# Patient Record
Sex: Female | Born: 1995
Health system: Southern US, Community
[De-identification: ages and names within clinical notes are randomized; demographics above are authoritative.]

## PROBLEM LIST (undated history)

## (undated) DIAGNOSIS — R001 Bradycardia, unspecified: Secondary | ICD-10-CM

## (undated) HISTORY — DX: Bradycardia, unspecified: R00.1

---
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
## 2018-04-05 ENCOUNTER — Encounter: Payer: Self-pay | Admitting: Family Medicine

## 2018-04-05 ENCOUNTER — Ambulatory Visit (INDEPENDENT_AMBULATORY_CARE_PROVIDER_SITE_OTHER): Payer: 59 | Admitting: Family Medicine

## 2018-04-05 VITALS — BP 100/70 | HR 48 | Ht 66.0 in | Wt 130.2 lb

## 2018-04-05 DIAGNOSIS — N911 Secondary amenorrhea: Secondary | ICD-10-CM | POA: Diagnosis not present

## 2018-04-05 DIAGNOSIS — R5383 Other fatigue: Secondary | ICD-10-CM | POA: Diagnosis not present

## 2018-04-05 DIAGNOSIS — E0789 Other specified disorders of thyroid: Secondary | ICD-10-CM | POA: Insufficient documentation

## 2018-04-05 DIAGNOSIS — Z789 Other specified health status: Secondary | ICD-10-CM | POA: Diagnosis not present

## 2018-04-05 LAB — MAGNESIUM: MAGNESIUM: 2.1 mg/dL (ref 1.5–2.5)

## 2018-04-05 LAB — CBC
HEMATOCRIT: 45.8 % (ref 36.0–46.0)
HEMOGLOBIN: 15.6 g/dL — AB (ref 12.0–15.0)
MCHC: 34.1 g/dL (ref 30.0–36.0)
MCV: 101.5 fl — ABNORMAL HIGH (ref 78.0–100.0)
Platelets: 251 10*3/uL (ref 150.0–400.0)
RBC: 4.51 Mil/uL (ref 3.87–5.11)
RDW: 12.6 % (ref 11.5–15.5)
WBC: 4.3 10*3/uL (ref 4.0–10.5)

## 2018-04-05 LAB — TSH: TSH: 1.95 u[IU]/mL (ref 0.35–4.50)

## 2018-04-05 LAB — VITAMIN B12: VITAMIN B 12: 1109 pg/mL — AB (ref 211–911)

## 2018-04-05 NOTE — Progress Notes (Signed)
Subjective:  Patient ID: Janet Hahn, female    DOB: 11/02/1996  Age: 22 y.o. MRN: [id number]  CC: Establish Care   HPI Janet Hahn presents for ongoing fatigue.  She is a Holiday representativesenior at [employer].  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  She runs up to 60 miles per week.  She has 2 periods per year.  This is her usual mileage.  She consumes a vegan diet.  She does not use illicit drugs, drink alcohol or smoke.  She has been sleeping normally.  She denies depression or anxiety.  She has a significant other and denies problems in the relationship.  [place]  She is planning on pursuing her nursing degree of blood upon completion of her undergraduate degree.  She tells me she has a history of an enlarged thyroid gland.  She is compliant with her oral contraceptives.  History Janet Hahn has no past medical history on file.   She has no past surgical history on file.   Her family history is not on file.She reports that she has never smoked. She has never used smokeless tobacco. Her alcohol and drug histories are not on file.  Outpatient Medications Prior to Visit  Medication Sig Dispense Refill  . levonorgestrel-ethinyl estradiol (NORDETTE) 0.15-30 MG-MCG tablet Take 1 tablet by mouth daily.     No facility-administered medications prior to visit.     ROS Review of Systems  Constitutional: Positive for fatigue. Negative for chills, fever and unexpected weight change.  HENT: Negative.   Eyes: Negative.   Respiratory: Negative for chest tightness, shortness of breath and wheezing.   Cardiovascular: Negative for chest pain, palpitations and leg swelling.  Gastrointestinal: Negative.   Endocrine: Negative for cold intolerance and heat intolerance.  Genitourinary: Negative.   Musculoskeletal: Negative for arthralgias, gait problem and joint swelling.  Skin: Negative for pallor and rash.  Allergic/Immunologic: Negative for immunocompromised state.    Neurological: Negative for headaches.  Hematological: Does not bruise/bleed easily.  Psychiatric/Behavioral: Negative for decreased concentration and dysphoric mood. The patient is not nervous/anxious.     Objective:  BP 100/70   Pulse (!) 48   Ht 5\' 6"  (1.676 m)   Wt 130 lb 4 oz (59.1 kg)   SpO2 99%   BMI 21.02 kg/m   Physical Exam  Constitutional: She is oriented to person, place, and time. She appears well-developed and well-nourished. No distress.  HENT:  Head: Normocephalic and atraumatic.  Right Ear: External ear normal.  Left Ear: External ear normal.  Nose: Nose normal.  Mouth/Throat: Oropharynx is clear and moist. No oropharyngeal exudate.  Eyes: Pupils are equal, round, and reactive to light. Conjunctivae and EOM are normal. Right eye exhibits no discharge. Left eye exhibits no discharge. No scleral icterus.  Neck: Normal range of motion. Neck supple. No JVD present. No tracheal deviation present. No thyromegaly (mild ttp) present.  Cardiovascular: Normal rate, regular rhythm and normal heart sounds.  Pulmonary/Chest: Effort normal and breath sounds normal.  Abdominal: Soft. Bowel sounds are normal. She exhibits no distension and no mass. There is no tenderness. There is no rebound and no guarding.  Musculoskeletal: She exhibits no edema.  Lymphadenopathy:    She has no cervical adenopathy.  Neurological: She is alert and oriented to person, place, and time.  Skin: Skin is warm and dry. She is not diaphoretic. No erythema. No pallor.  Psychiatric: She has a normal mood and affect.  Her behavior is normal. Thought content normal.      Assessment & Plan:   Janet Hahn was seen today for establish care.  Diagnoses and all orders for this visit:  Fatigue, unspecified type -     CBC -     TSH -     Iron, TIBC and Ferritin Panel  Consumes a vegan diet -     CBC -     Iron, TIBC and Ferritin Panel -     Magnesium -     B12  Secondary physiologic  amenorrhea  Painful thyroid -     TSH -     T3 -     Thyroid stimulating immunoglobulin   I have discontinued [name]'s levonorgestrel-ethinyl estradiol.  No orders of the defined types were placed in this encounter.    Follow-up: Return in about 1 month (around 05/05/2018).  Mliss Sax, MD

## 2018-04-05 NOTE — Patient Instructions (Signed)
Overtraining in Athletes Overtraining is when an athlete trains too hard or for too long. Overtraining can cause a variety of problems. It can hurt athletic performance, lead to injury, and cause physical and mental symptoms. Overtraining is also called burnout. What are the causes? This condition is caused by working too hard and too long at a sport or activity. It happens when the body does not have enough time to recover. This condition can happen:  If you repeat the same motions every day, with no variety in activity.  If you practice the same sport for more than 5 days a week with no break during those 5 days.  If you practice the same sport for more than 10 months a year with no break during those 10 months.  What increases the risk? This condition is more likely to develop in:  Adolescents whose bodies are still developing and growing.  Athletes who specialize in one sport.  Athletes who do not take adequate breaks between days of practice or between seasons of competition.  What are the signs or symptoms? Symptoms of this condition include:  Joint and muscle pain.  Fast heart rate.  Loss of appetite.  Getting sick more often than before.  Fatigue.  Trouble concentrating.  Lack of usual interest in a sport.  Difficulty completing usual routines and practices.  Changes in personality, such as being irritable or moody.  How is this diagnosed? This condition is diagnosed based on symptoms and the history of physical activity. Your health care provider may do a physical exam. How is this treated? This condition is treated by making changes to your usual routine. Changes may include:  Resting regularly.  Taking a break from your training.  Adding more variety into your workouts. This will also help prevent repetitive use injuries.  Reducing your training and competition schedule.  Follow these instructions at home:  Rest as told by your health care  provider.  Make changes to your usual routine as recommended by your health care provider. How is this prevented?  Focus on variety and fun in athletic training, rather than on repetition and intensity.  Take breaks between practices and seasons as needed.  Do not increase repetitions, distance, or other training goals by more than 10 percent each week.  Do not push yourself too hard. Contact a health care provider if:  You have lasting joint and muscle pain.  Your resting heart rate is faster than normal.  You are getting sick more often than before.  You lose your appetite.  You feel sad.  You have trouble concentrating or doing daily tasks. This information is not intended to replace advice given to you by your health care provider. Make sure you discuss any questions you have with your health care provider. Document Released: 10/23/2005 Document Revised: 12/07/2015 Document Reviewed: 05/12/2015 Elsevier Interactive Patient Education  2018 [employer].  Vegan Diet The vegan diet excludes all foods that come from animals, including foods that are made from meat, fish, poultry, dairy, and eggs. A vegan diet may be followed for ethical or health reasons. What do I need to know about the vegan diet?  This diet includes all foods that come from plants. Some people choose not to eat sea vegetables, such as seaweed.  This diet excludes foods that come from animals.  While following this diet, it is important to find plant sources or supplements that contain the nutrients that are commonly found in animal products. Talk with your  health care provider or dietitian about whether you should be taking nutritional supplements. What foods can I eat? Grains  Any. Vegetables Any (except for sea vegetables, if you choose not to eat them). Fruits Any. Protein Sources Tofu. Tempeh. Beans. Nuts. Seeds. Dairy Any dairy product that is made with milk that comes from soy, almonds,  rice, hemp, or coconut. Beverages Juice. Carbonated soft drinks. Coconut milk. Tea. Sweets and Desserts Any dessert that is labeled as vegan. Ice cream that is made with soy, almond, rice, hemp, or coconut milk and does not have other animal ingredients (such as chocolate chips that are made from cow milk). Fats and Oils All vegetable-based oils, such as olive, canola, coconut, corn, safflower, peanut, and sesame oils. All vegan butters. The items listed above may not be a complete list of recommended foods or beverages. Contact your dietitian for more options. What foods should I avoid? Grains No grains need to be avoided. All grains are included in this diet. Vegetables Sea vegetables (if you choose not to eat them). Fruits No fruits need to be avoided. All fruits are included in this diet. Protein Sources Meats. Poultry. Eggs. Fish. Seafood. Dairy Milk, cheese, yogurt, or ice cream that is made from cow milk, goat milk, or sheep milk. Beverages Cow milk. Goat milk. Sheep milk. Drinks that are sweetened with honey. Condiments Honey. Sweets and Desserts Any desserts that are made with eggs or animal milk. Honey. Cheesecake. Fats and Oils Butter. Lard. The items listed above may not be a complete list of foods and beverages to avoid. Contact your dietitian for more information. Where can I get the nutrients that are commonly found in animal products? Nutrients that are commonly found in animal products and may not be as common in a vegan diet include:  Protein.  Vitamin B12.  Vitamin D.  Iron.  Omega-3 fatty acids.  Calcium.  Zinc.  The following is a list of some plant-based sources of these nutrients. Protein Beans, such as black beans or kidney beans. Other legumes, such as lentils and split peas. Soy products. Nuts, such as almonds, [place] nuts, and pecans. Seeds, such as sunflower seeds. Tofu. Tempeh. Hummus. Vitamin B12 Breakfast cereals and prepared products  that have added vitamin B12. Vitamin D Orange juice. Fortified mushrooms. Cereals with added vitamin D. Iron Dark leafy greens. Nuts. Beans. Grain products that have added iron, such as cereals. Tofu. Tempeh. Soybeans. Quinoa. Plant-based iron is absorbed better when it is eaten with a food that has vitamin C. Omega-3 Fatty Acids Walnuts. Foods with added omega-3 fatty acids, such as juices. Flax seeds. Canola oil and soybean oil. Tofu. Calcium Dark leafy greens, such as kale, bok choy, Chinese cabbage, collard greens, and mustard greens. Broccoli. Okra. Soy products with added calcium. Calcium-fortified breakfast cereals. Calcium-fortified fruit juices. Figs. Zinc Wheat germ, cereals, and breads that have added zinc. Baked beans. Legumes, such as cashews, chickpeas, kidney beans, and green peas. Almonds and nut butters. Tofu and other soy products. This information is not intended to replace advice given to you by your health care provider. Make sure you discuss any questions you have with your health care provider. Document Released: 06/26/2014 Document Revised: 05/12/2016 Document Reviewed: 04/07/2014 Elsevier Interactive Patient Education  [employer].

## 2018-04-07 LAB — IRON,TIBC AND FERRITIN PANEL
%SAT: 61 % — AB (ref 11–50)
Ferritin: 36 ng/mL (ref 10–154)
Iron: 213 ug/dL — ABNORMAL HIGH (ref 40–190)
TIBC: 347 mcg/dL (calc) (ref 250–450)

## 2018-04-07 LAB — THYROID STIMULATING IMMUNOGLOBULIN

## 2018-04-07 LAB — T3: T3 TOTAL: 94 ng/dL (ref 76–181)

## 2018-09-24 ENCOUNTER — Encounter: Payer: Self-pay | Admitting: Family Medicine

## 2018-09-24 ENCOUNTER — Ambulatory Visit: Payer: BLUE CROSS/BLUE SHIELD | Admitting: Family Medicine

## 2018-09-24 VITALS — BP 90/60 | HR 46 | Ht 66.0 in | Wt 105.2 lb

## 2018-09-24 DIAGNOSIS — R634 Abnormal weight loss: Secondary | ICD-10-CM | POA: Insufficient documentation

## 2018-09-24 DIAGNOSIS — M818 Other osteoporosis without current pathological fracture: Secondary | ICD-10-CM

## 2018-09-24 DIAGNOSIS — R011 Cardiac murmur, unspecified: Secondary | ICD-10-CM

## 2018-09-24 DIAGNOSIS — Z789 Other specified health status: Secondary | ICD-10-CM | POA: Diagnosis not present

## 2018-09-24 DIAGNOSIS — F509 Eating disorder, unspecified: Secondary | ICD-10-CM | POA: Diagnosis not present

## 2018-09-24 DIAGNOSIS — N912 Amenorrhea, unspecified: Secondary | ICD-10-CM

## 2018-09-24 DIAGNOSIS — N911 Secondary amenorrhea: Secondary | ICD-10-CM

## 2018-09-24 LAB — COMPREHENSIVE METABOLIC PANEL
ALBUMIN: 4.5 g/dL (ref 3.5–5.2)
ALT: 24 U/L (ref 0–35)
AST: 22 U/L (ref 0–37)
Alkaline Phosphatase: 52 U/L (ref 39–117)
BILIRUBIN TOTAL: 1 mg/dL (ref 0.2–1.2)
BUN: 10 mg/dL (ref 6–23)
CALCIUM: 9.9 mg/dL (ref 8.4–10.5)
CO2: 25 meq/L (ref 19–32)
CREATININE: 0.72 mg/dL (ref 0.40–1.20)
Chloride: 103 mEq/L (ref 96–112)
GFR: 108.12 mL/min (ref >60.00)
Glucose, Bld: 73 mg/dL (ref 70–99)
Potassium: 4.2 mEq/L (ref 3.5–5.1)
SODIUM: 140 meq/L (ref 135–145)
Total Protein: 6.9 g/dL (ref 6.0–8.3)

## 2018-09-24 LAB — CBC
HEMATOCRIT: 46.6 % — AB (ref 36.0–46.0)
HEMOGLOBIN: 15.9 g/dL — AB (ref 12.0–15.0)
MCHC: 34.1 g/dL (ref 30.0–36.0)
MCV: 103.6 fl — ABNORMAL HIGH (ref 78.0–100.0)
Platelets: 210 10*3/uL (ref 150.0–400.0)
RBC: 4.49 Mil/uL (ref 3.87–5.11)
RDW: 13.4 % (ref 11.5–15.5)
WBC: 3 10*3/uL — ABNORMAL LOW (ref 4.0–10.5)

## 2018-09-24 LAB — VITAMIN D 25 HYDROXY (VIT D DEFICIENCY, FRACTURES): VITD: 33.86 ng/mL (ref 30.00–100.00)

## 2018-09-24 LAB — VITAMIN B12: Vitamin B-12: 1002 pg/mL — ABNORMAL HIGH (ref 211–911)

## 2018-09-24 LAB — MAGNESIUM: MAGNESIUM: 2.1 mg/dL (ref 1.5–2.5)

## 2018-09-24 NOTE — Progress Notes (Signed)
Subjective:  Patient ID: Janet MoanHolly R Hahn, female    DOB: 10/18/1996  Age: 22 y.o. MRN: [id number]  CC: discuss weight   HPI Janet Hahn presents for follow-up of her fatigue and physiological amenorrhea for 2 years now.  This coincides with her vegan diet.  She has lost another 25 pounds since her last clinic visit.  She is maintaining a vigorous running schedule of 60 miles per week followed by 2 days of weight training.  Her training regimen has not varied.  She maintains that she is feeling quite well.  Recovery times okay from running are excellent.  She does recognize the fact that she is too thin.  She consumes a strict vegan diet.  Her protein intake comes exclusively from legumes and grains.  She denies binging and purging.  Patient denies night sweats.  No outpatient medications prior to visit.   No facility-administered medications prior to visit.     ROS Review of Systems  Constitutional: Positive for unexpected weight change. Negative for chills, diaphoresis, fatigue and fever.  HENT: Negative.   Eyes: Negative.   Respiratory: Negative.  Negative for chest tightness, shortness of breath and wheezing.   Cardiovascular: Negative.  Negative for chest pain, palpitations and leg swelling.  Endocrine: Negative for polyphagia.  Genitourinary: Negative.   Musculoskeletal: Negative for gait problem and joint swelling.  Skin: Negative for pallor and rash.  Allergic/Immunologic: Negative for immunocompromised state.  Neurological: Negative for weakness and numbness.  Hematological: Does not bruise/bleed easily.  Psychiatric/Behavioral: Negative for behavioral problems and dysphoric mood. The patient is not nervous/anxious.     Objective:  BP 90/60 (BP Location: Right Arm, Patient Position: Sitting, Cuff Size: Normal)   Pulse (!) 46   Ht 5\' 6"  (1.676 m)   Wt 105 lb 4 oz (47.7 kg)   SpO2 100%   BMI 16.99 kg/m   BP Readings from Last 3 Encounters:  09/24/18 90/60    04/05/18 100/70  07/06/15 (!) 100/50    Wt Readings from Last 3 Encounters:  09/24/18 105 lb 4 oz (47.7 kg)  04/05/18 130 lb 4 oz (59.1 kg)  07/06/15 132 lb 9.6 oz (60.1 kg) (64 %, Z= 0.36)*   * Growth percentiles are based on CDC (Girls, 2-20 Years) data.    Physical Exam  Constitutional: She is oriented to person, place, and time.  HENT:  Head: Normocephalic and atraumatic.  Right Ear: External ear normal.  Left Ear: External ear normal.  Mouth/Throat: Oropharynx is clear and moist. No oropharyngeal exudate.  Eyes: Pupils are equal, round, and reactive to light. Conjunctivae and EOM are normal. Right eye exhibits no discharge. Left eye exhibits no discharge. No scleral icterus.  Neck: Neck supple. No JVD present. No tracheal deviation present. No thyromegaly present.  Cardiovascular: Normal rate and regular rhythm.  Murmur heard. Pulmonary/Chest: Effort normal and breath sounds normal.  Abdominal: Soft. Bowel sounds are normal. She exhibits no distension and no mass. There is no tenderness. There is no guarding.  Musculoskeletal: She exhibits no edema.  Neurological: She is alert and oriented to person, place, and time.  Skin: Skin is warm and dry. Capillary refill takes less than 2 seconds. There is pallor.  Psychiatric: She has a normal mood and affect. Her behavior is normal.   Depression screen [facility] 2/9 09/24/2018  Decreased Interest 0  Down, Depressed, Hopeless 0  PHQ - 2 Score 0  Altered sleeping 0  Tired, decreased energy 0  Change in appetite  0  Feeling bad or failure about yourself  0  Trouble concentrating 1  Moving slowly or fidgety/restless 0  Suicidal thoughts 0  PHQ-9 Score 1     Lab Results  Component Value Date   WBC 4.3 04/05/2018   HGB 15.6 (H) 04/05/2018   HCT 45.8 04/05/2018   PLT 251.0 04/05/2018   GLUCOSE 85 07/06/2015   ALT 15 07/06/2015   AST 19 07/06/2015   NA 137 07/06/2015   K 4.5 07/06/2015   CL 104 07/06/2015   CREATININE 0.68  07/06/2015   BUN 15 07/06/2015   CO2 23 07/06/2015   TSH 1.95 04/05/2018    Patient was never admitted.  Assessment & Plan:   Janet Hahn was seen today for discuss weight.  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  No orders of the defined types were placed in this encounter.  Patient is agreeable to seeing sports medicine.  She was introduced to Dr. Jordan Likes.  Recommended nutritional counseling and her coach would like for her to see the athletic nutritionist at [facility]  Patient may also benefit from psychology referral.  PHQ 9 score was 1 today.  Follow-up: Return in about 6 weeks (around 11/05/2018).  Mliss Sax, MD

## 2018-09-24 NOTE — Patient Instructions (Signed)
Menstrual Dysfunction in Athletes  Exercise has many health benefits, including maintaining good heart and lung function, strength, and flexibility. However, for girls and women, extreme amounts or intensity of exercise can have some less desirable effects, especially on menstrual cycles. This can result in periods that do not occur regularly or are abnormally absent (menstrual dysfunction). This may also be called amenorrhea. There are different types of menstrual dysfunction:  · Oligomenorrhea. This is having fewer than normal periods per year, or having longer than normal time lapses between periods.  · Primary amenorrhea. This is when a girl has not yet started having her period (menarche) by the time she is 21 years old. For some girls, genetic factors may delay menarche.  · Secondary amenorrhea. This is the absence of three or more periods in a row, after menarche has already taken place.  · Athletic amenorrhea. This is the absence of periods that is caused by exercise. It can be primary or secondary, depending on whether menarche has already occurred.    What are the causes?  There is no known single cause of menstrual dysfunction in athletes. These conditions can develop for different reasons in different women. In general, these conditions are thought to occur because of:  · Hormone imbalance.  · Altered metabolism.  · Decreased body fat percentage.  · Genetic inheritance.  · Psychological stress.  · Ongoing negative energy balance. This means that you consume fewer calories than you burn each day.    What increases the risk?  This condition is more likely to develop in:  · Girls and women who do activities that encourage or result in leanness, such as endurance sports, body building, and dance.  · Girls and women who have experienced rapid weight loss.  · Girls and women who engage in frequent, vigorous exercise or sports training.  · Girls and women who have a mother who experienced menstrual  dysfunction.    What are the signs or symptoms?  Symptoms of this condition include:  · Absence of menstrual periods.  · Irregular menstrual periods.  · Increasing intervals of time between menstrual periods.  · Stress fractures. These are small breaks or cracks in a bone.    How is this diagnosed?  This condition is diagnosed with a medical history and physical exam. Your health care provider will ask you about your menstrual history and ask how much you exercise. You may also need to have tests to help confirm the diagnosis and to ensure that your menstrual dysfunction is not caused by another medical condition. These tests may include:  · Blood tests.  · Urine tests.  · Dietary evaluation by a specialist (dietitian).  · Body fat composition analysis.  · A test to check how dense your bones are (bone densitometry).  · Ultrasound.    How is this treated?  Treatment for menstrual dysfunction in athletes depends on the suspected cause of the condition. Some treatment options include:  · Decreasing the amount or intensity of daily exercise.  · Increasing body weight.  · Reducing stress.  · Improving the amount or quality of sleep.  · Medicines. These may include hormone replacement.  · Vitamin and mineral supplements.    Your health care provider may also advise you to work with a registered dietitian to make changes to your diet.  Follow these instructions at home:  · Exercise as told by your health care provider.  · Try to reduce your stress, such as with yoga   or meditation. If you need help doing this, ask your health care provider.  · Take over-the-counter and prescription medicines only as told by your health care provider.  · Take vitamin and mineral supplements only as told by your health care provider.  · Get enough sleep. Talk with your health care provider about how much sleep you should be getting.  · Try to maintain or gain weight as told by your health care provider. This may include eating a diet as  suggested by your dietitian. Avoid trying to lose weight unless it has been discussed with your health care provider.  · Keep all follow-up visits as told by your health care provider. This is important.  This information is not intended to replace advice given to you by your health care provider. Make sure you discuss any questions you have with your health care provider.  Document Released: 10/23/2005 Document Revised: 03/30/2016 Document Reviewed: 12/31/2014  Elsevier Interactive Patient Education © 2018 [employer] Inc[employer]

## 2018-09-25 LAB — HIV ANTIBODY (ROUTINE TESTING W REFLEX): HIV 1&2 Ab, 4th Generation: NONREACTIVE

## 2018-09-25 LAB — IRON,TIBC AND FERRITIN PANEL
%SAT: 53 % (calc) — ABNORMAL HIGH (ref 16–45)
Ferritin: 59 ng/mL (ref 16–154)
Iron: 161 ug/dL (ref 40–190)
TIBC: 301 mcg/dL (calc) (ref 250–450)

## 2018-10-01 ENCOUNTER — Ambulatory Visit: Payer: BLUE CROSS/BLUE SHIELD | Admitting: Family Medicine

## 2018-10-01 ENCOUNTER — Encounter: Payer: Self-pay | Admitting: Family Medicine

## 2018-10-01 VITALS — BP 100/68 | HR 46 | Temp 97.7°F | Ht 67.0 in | Wt 105.4 lb

## 2018-10-01 DIAGNOSIS — N912 Amenorrhea, unspecified: Secondary | ICD-10-CM | POA: Diagnosis not present

## 2018-10-01 DIAGNOSIS — R634 Abnormal weight loss: Secondary | ICD-10-CM

## 2018-10-01 NOTE — Progress Notes (Signed)
   Subjective:    Patient ID: Janet Hahn, female    DOB: 02/09/1996, 22 y.o.   MRN: [id number]  HPI She is here for consult concerning weight loss.  I was asked to see her because I am the [facility].  She states that over the last 6 months she has had an over 20 pound weight loss.  She states that she has not tried to lose weight and when she noted that her weight was down to 115 pounds she realized that she was too thin.  She does have a previous history of binge eating and states that she has not done that in over 6 months.  She is a vegan. she has [facility] getting [facility].  She is also involved with the nutritionist there.  She has a history of amenorrhea for the last 2 years.  She runs roughly 60 miles per week but her season is over for right now but will soon start up again. She was seen May 31 and November 19 by Dr. Doreene Hahn.  Those notes were reviewed.   Review of Systems     Objective:   Physical Exam Alert and in no distress. Tympanic membranes and canals are normal. Pharyngeal area is normal. Neck is supple without adenopathy or thyromegaly. Cardiac exam shows a regular sinus rhythm without murmurs or gallops. Lungs are clear to auscultation.  Abdominal exam shows no hepatosplenomegaly masses or tenderness Blood work was reviewed and does show evidence of protein deficiency.       Assessment & Plan:  Weight loss - Plan: Prealbumin  Amenorrhea - Plan: DG Bone Density Interesting case of someone who recognizes that her weight is too low and actually wants to put on weight.  Her previous history of binge eating and occasionally purging does create problems..  Her vegan diet does complicate the issue.  I will order a pre-albumin and DEXA scan. Negotiated with her to cut down on her running to 20 miles per week and do it over a 4-day.  Rather than 6.  She was comfortable with that.  She will also continue in counseling with  Janet Hahn, her sports psychologist and continue to work with Janet Hahn on nutrition.  I explained that we eventually want her to be able to gain weight and have normal menses.  She will also be starting a multivitamin. Also discussed the fact that I will work with her on this but do not plan on becoming her PCP.

## 2018-10-02 ENCOUNTER — Encounter: Payer: Self-pay | Admitting: Family Medicine

## 2018-10-02 LAB — PREALBUMIN: PREALBUMIN: 25 mg/dL (ref 14–35)

## 2018-10-08 ENCOUNTER — Other Ambulatory Visit: Payer: Self-pay

## 2018-10-08 DIAGNOSIS — M818 Other osteoporosis without current pathological fracture: Principal | ICD-10-CM

## 2018-10-08 DIAGNOSIS — N912 Amenorrhea, unspecified: Secondary | ICD-10-CM

## 2018-10-08 DIAGNOSIS — F509 Eating disorder, unspecified: Secondary | ICD-10-CM

## 2018-10-08 DIAGNOSIS — R634 Abnormal weight loss: Secondary | ICD-10-CM

## 2018-10-15 ENCOUNTER — Ambulatory Visit: Payer: BLUE CROSS/BLUE SHIELD | Admitting: Family Medicine

## 2018-10-15 ENCOUNTER — Encounter: Payer: Self-pay | Admitting: Family Medicine

## 2018-10-15 VITALS — BP 100/62 | HR 52 | Temp 97.9°F | Wt 105.2 lb

## 2018-10-15 DIAGNOSIS — M818 Other osteoporosis without current pathological fracture: Secondary | ICD-10-CM | POA: Diagnosis not present

## 2018-10-15 DIAGNOSIS — F509 Eating disorder, unspecified: Secondary | ICD-10-CM | POA: Diagnosis not present

## 2018-10-15 DIAGNOSIS — N912 Amenorrhea, unspecified: Secondary | ICD-10-CM | POA: Diagnosis not present

## 2018-10-15 NOTE — Progress Notes (Signed)
   Subjective:    Patient ID: Janet Hahn, female    DOB: 06/05/1996, 22 y.o.   MRN: [id number]  HPI She is here for a recheck.  She has been limiting her running to 20 miles per week 5 days a week.  She plans to meet with a dietary specialist to help with meal planning.  Right now she is on break and getting ready for exams and will be going home for short period of time.   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  She seems to have a good attitude towards what her problem is.  I will keep her at 20 miles per week but she can spread it out either over 4 or 5 days.  Recheck here in 1 month.  Discussed having a regular menstrual cycle but will not push this as this might take several months even if she does gain weight.

## 2018-10-15 NOTE — Patient Instructions (Signed)
Continue 20 miles per week but either over 4 or 5 days

## 2018-10-16 ENCOUNTER — Ambulatory Visit (HOSPITAL_COMMUNITY): Payer: BLUE CROSS/BLUE SHIELD

## 2018-10-24 DIAGNOSIS — Z713 Dietary counseling and surveillance: Secondary | ICD-10-CM | POA: Diagnosis not present

## 2018-10-25 ENCOUNTER — Telehealth: Payer: Self-pay

## 2018-10-25 NOTE — Telephone Encounter (Signed)
Schedule her for an appointment

## 2018-10-25 NOTE — Telephone Encounter (Signed)
Janet EdgeLaura Hahn with [facility]  is concerned about pt heart. She is requesting to have a ECG ordered. Also would like orthotasts done on pt. Info can be faxed to [phone] pt is dizzy everyday and slower heart rate.  Janet RiegerLaura office number is [phone] Please advised if ok . KH

## 2018-10-25 NOTE — Telephone Encounter (Signed)
LVM for pt to call and make an appt. KH 

## 2018-10-29 ENCOUNTER — Ambulatory Visit
Admission: RE | Admit: 2018-10-29 | Discharge: 2018-10-29 | Disposition: A | Discharge status: Home / Self Care | Payer: BLUE CROSS/BLUE SHIELD | Source: Ambulatory Visit | Attending: Family Medicine | Admitting: Family Medicine

## 2018-10-29 ENCOUNTER — Other Ambulatory Visit: Payer: BLUE CROSS/BLUE SHIELD

## 2018-10-29 DIAGNOSIS — F509 Eating disorder, unspecified: Secondary | ICD-10-CM

## 2018-10-29 DIAGNOSIS — M8589 Other specified disorders of bone density and structure, multiple sites: Secondary | ICD-10-CM | POA: Diagnosis not present

## 2018-10-29 DIAGNOSIS — M818 Other osteoporosis without current pathological fracture: Principal | ICD-10-CM

## 2018-10-29 DIAGNOSIS — R634 Abnormal weight loss: Secondary | ICD-10-CM

## 2018-10-29 DIAGNOSIS — N912 Amenorrhea, unspecified: Secondary | ICD-10-CM

## 2018-10-31 ENCOUNTER — Ambulatory Visit: Payer: BLUE CROSS/BLUE SHIELD | Admitting: Family Medicine

## 2018-10-31 VITALS — BP 102/68 | HR 50 | Temp 97.8°F | Wt 102.6 lb

## 2018-10-31 DIAGNOSIS — F509 Eating disorder, unspecified: Secondary | ICD-10-CM

## 2018-10-31 DIAGNOSIS — M818 Other osteoporosis without current pathological fracture: Secondary | ICD-10-CM | POA: Diagnosis not present

## 2018-10-31 DIAGNOSIS — N912 Amenorrhea, unspecified: Secondary | ICD-10-CM | POA: Diagnosis not present

## 2018-10-31 NOTE — Progress Notes (Signed)
   Subjective:    Patient ID: Janet Hahn, female    DOB: 08/06/1996, 22 y.o.   MRN: [id number]  HPI She is here for a recheck.  Her weight is down a few pounds but she says she has less close on them with her last visit.  Also since she has lost weight she is no longer running.  She has an appointment to see her nutritionist on January 6.  She is a vegan.  She does state that she is eating more and does recognize the fact that she is way too thin.  She has a phone call with her therapist tomorrow.  Recent DEXA scan did show a Z score of -1.5.   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

## 2018-11-01 ENCOUNTER — Encounter: Payer: Self-pay | Admitting: Family Medicine

## 2018-11-01 ENCOUNTER — Ambulatory Visit: Payer: BLUE CROSS/BLUE SHIELD | Admitting: Family Medicine

## 2018-11-01 ENCOUNTER — Telehealth: Payer: Self-pay | Admitting: Family Medicine

## 2018-11-01 ENCOUNTER — Ambulatory Visit (INDEPENDENT_AMBULATORY_CARE_PROVIDER_SITE_OTHER): Payer: BLUE CROSS/BLUE SHIELD | Admitting: Family Medicine

## 2018-11-01 VITALS — HR 48 | Temp 98.3°F

## 2018-11-01 DIAGNOSIS — R634 Abnormal weight loss: Secondary | ICD-10-CM

## 2018-11-01 DIAGNOSIS — F509 Eating disorder, unspecified: Secondary | ICD-10-CM

## 2018-11-01 DIAGNOSIS — M818 Other osteoporosis without current pathological fracture: Secondary | ICD-10-CM

## 2018-11-01 DIAGNOSIS — N912 Amenorrhea, unspecified: Secondary | ICD-10-CM | POA: Diagnosis not present

## 2018-11-01 NOTE — Progress Notes (Addendum)
   Subjective:    Patient ID: Janet Hahn, female    DOB: 09/26/1996, 22 y.o.   MRN: [id number]  HPI She is here for follow-up.  I discussed her care with her therapist ,dietitian and athletic trainer.  She does have a previous history of bulimia.  She is being monitored closely with her weight.  Her dietitian has not had her do calorie counting but does work with her on making sure that her plate is filled with appropriate kinds of foods.  She does complain of some abdominal bloating and does note dizziness especially when she changes positions from sitting to standing.  She is weighed at the training room but her weight is not given to her.  She does complain of some abdominal bloating.  She is a vegan.  Her dietitian has concerns over her low heart rate and abdominal symptoms.  Review of Systems     Objective:   Physical Exam Alert and in no distress otherwise not examined EKG does show a significant bradycardia however with increased physical activity her heart rate did go up to 88.      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  She is to weigh herself Monday in the athletic training room.  She is weighing herself there regularly with the same clothes on. I will also contact Dr. Delorse LekMartha Perry who works a lot with [facility]. We are all in agreement that this is a very tenuous situation and unless she starts to turn the corner we might need to get more aggressive and possibly admit her. The heart rate is noted however she is a runner and even though she has not been running in the last week this could be related to that or  possibly to her underlying metabolic condition.  We will continue to monitor this closely.

## 2018-11-01 NOTE — Telephone Encounter (Signed)
Vernona RiegerLaura registered dietician from [facility] [employer] t# [phone] called & states she would like some additional blood work on pt, CBC, CMP, TSH, LH, FSH, Prolactin & thyroid testing for malnutrition

## 2018-11-02 LAB — COMPREHENSIVE METABOLIC PANEL
ALT: 47 IU/L — AB (ref 0–32)
AST: 25 IU/L (ref 0–40)
Albumin/Globulin Ratio: 2 (ref 1.2–2.2)
Albumin: 4 g/dL (ref 3.5–5.5)
Alkaline Phosphatase: 70 IU/L (ref 39–117)
BUN/Creatinine Ratio: 14 (ref 9–23)
BUN: 10 mg/dL (ref 6–20)
Bilirubin Total: 0.4 mg/dL (ref 0.0–1.2)
CO2: 26 mmol/L (ref 20–29)
Calcium: 9 mg/dL (ref 8.7–10.2)
Chloride: 103 mmol/L (ref 96–106)
Creatinine, Ser: 0.71 mg/dL (ref 0.57–1.00)
GFR calc Af Amer: 141 mL/min/{1.73_m2} (ref >59)
GFR calc non Af Amer: 122 mL/min/{1.73_m2} (ref >59)
GLUCOSE: 78 mg/dL (ref 65–99)
Globulin, Total: 2 g/dL (ref 1.5–4.5)
Potassium: 4 mmol/L (ref 3.5–5.2)
Sodium: 142 mmol/L (ref 134–144)
Total Protein: 6 g/dL (ref 6.0–8.5)

## 2018-11-02 LAB — CBC WITH DIFFERENTIAL/PLATELET
BASOS ABS: 0 10*3/uL (ref 0.0–0.2)
Basos: 1 %
EOS (ABSOLUTE): 0.1 10*3/uL (ref 0.0–0.4)
Eos: 2 %
HEMOGLOBIN: 15.6 g/dL (ref 11.1–15.9)
Hematocrit: 44.7 % (ref 34.0–46.6)
IMMATURE GRANS (ABS): 0 10*3/uL (ref 0.0–0.1)
IMMATURE GRANULOCYTES: 0 %
LYMPHS: 34 %
Lymphocytes Absolute: 1.5 10*3/uL (ref 0.7–3.1)
MCH: 36.1 pg — ABNORMAL HIGH (ref 26.6–33.0)
MCHC: 34.9 g/dL (ref 31.5–35.7)
MCV: 104 fL — ABNORMAL HIGH (ref 79–97)
MONOCYTES: 8 %
Monocytes Absolute: 0.4 10*3/uL (ref 0.1–0.9)
NEUTROS ABS: 2.5 10*3/uL (ref 1.4–7.0)
NEUTROS PCT: 55 %
PLATELETS: 243 10*3/uL (ref 150–450)
RBC: 4.32 x10E6/uL (ref 3.77–5.28)
RDW: 12 % — ABNORMAL LOW (ref 12.3–15.4)
WBC: 4.5 10*3/uL (ref 3.4–10.8)

## 2018-11-02 LAB — FERRITIN: FERRITIN: 108 ng/mL (ref 15–150)

## 2018-11-02 LAB — IRON AND TIBC
Iron Saturation: 39 % (ref 15–55)
Iron: 93 ug/dL (ref 27–159)
Total Iron Binding Capacity: 237 ug/dL — ABNORMAL LOW (ref 250–450)
UIBC: 144 ug/dL (ref 131–425)

## 2018-11-02 LAB — PREALBUMIN: PREALBUMIN: 24 mg/dL (ref 14–35)

## 2018-11-02 LAB — MAGNESIUM: Magnesium: 2.2 mg/dL (ref 1.6–2.3)

## 2018-11-02 LAB — TSH: TSH: 1.98 u[IU]/mL (ref 0.450–4.500)

## 2018-11-02 NOTE — Telephone Encounter (Signed)
Labs drawn

## 2018-11-04 ENCOUNTER — Telehealth: Payer: Self-pay

## 2018-11-04 NOTE — Telephone Encounter (Signed)
Pt counseling Janet Hahn was advise the ordered labs she had requested had been done. Faxed and emailed results to her. KH

## 2018-11-04 NOTE — Addendum Note (Signed)
Addended by: Renelda LomaHENRY, Adlene Adduci on: 11/04/2018 02:57 PM   Modules accepted: Orders

## 2018-11-05 ENCOUNTER — Telehealth: Payer: Self-pay | Admitting: Pediatrics

## 2018-11-05 ENCOUNTER — Inpatient Hospital Stay (HOSPITAL_COMMUNITY)
Admission: AD | Admit: 2018-11-05 | Discharge: 2018-11-09 | DRG: 887 | Disposition: A | Discharge status: Home / Self Care | Payer: BLUE CROSS/BLUE SHIELD | Source: Ambulatory Visit | Attending: Family Medicine | Admitting: Family Medicine

## 2018-11-05 ENCOUNTER — Encounter: Payer: Self-pay | Admitting: Family Medicine

## 2018-11-05 DIAGNOSIS — M8580 Other specified disorders of bone density and structure, unspecified site: Secondary | ICD-10-CM | POA: Diagnosis not present

## 2018-11-05 DIAGNOSIS — E878 Other disorders of electrolyte and fluid balance, not elsewhere classified: Secondary | ICD-10-CM | POA: Diagnosis present

## 2018-11-05 DIAGNOSIS — I4581 Long QT syndrome: Secondary | ICD-10-CM | POA: Diagnosis not present

## 2018-11-05 DIAGNOSIS — E440 Moderate protein-calorie malnutrition: Secondary | ICD-10-CM | POA: Diagnosis present

## 2018-11-05 DIAGNOSIS — Z681 Body mass index (BMI) 19 or less, adult: Secondary | ICD-10-CM | POA: Diagnosis not present

## 2018-11-05 DIAGNOSIS — Z8659 Personal history of other mental and behavioral disorders: Secondary | ICD-10-CM

## 2018-11-05 DIAGNOSIS — E671 Hypercarotinemia: Secondary | ICD-10-CM | POA: Diagnosis not present

## 2018-11-05 DIAGNOSIS — R945 Abnormal results of liver function studies: Secondary | ICD-10-CM | POA: Diagnosis present

## 2018-11-05 DIAGNOSIS — R9431 Abnormal electrocardiogram [ECG] [EKG]: Secondary | ICD-10-CM | POA: Diagnosis present

## 2018-11-05 DIAGNOSIS — N911 Secondary amenorrhea: Secondary | ICD-10-CM | POA: Diagnosis present

## 2018-11-05 DIAGNOSIS — R001 Bradycardia, unspecified: Secondary | ICD-10-CM | POA: Diagnosis present

## 2018-11-05 DIAGNOSIS — E441 Mild protein-calorie malnutrition: Secondary | ICD-10-CM | POA: Diagnosis present

## 2018-11-05 DIAGNOSIS — F502 Bulimia nervosa: Principal | ICD-10-CM | POA: Diagnosis present

## 2018-11-05 DIAGNOSIS — R634 Abnormal weight loss: Secondary | ICD-10-CM | POA: Diagnosis not present

## 2018-11-05 HISTORY — DX: Bradycardia, unspecified: R00.1

## 2018-11-05 LAB — COMPREHENSIVE METABOLIC PANEL
ALT: 48 U/L — ABNORMAL HIGH (ref 0–44)
AST: 29 U/L (ref 15–41)
Albumin: 3.4 g/dL — ABNORMAL LOW (ref 3.5–5.0)
Alkaline Phosphatase: 46 U/L (ref 38–126)
Anion gap: 7 (ref 5–15)
BUN: 8 mg/dL (ref 6–20)
CALCIUM: 9 mg/dL (ref 8.9–10.3)
CHLORIDE: 104 mmol/L (ref 98–111)
CO2: 30 mmol/L (ref 22–32)
Creatinine, Ser: 0.68 mg/dL (ref 0.44–1.00)
GFR calc Af Amer: >60 mL/min (ref >60)
GFR calc non Af Amer: >60 mL/min (ref >60)
Glucose, Bld: 98 mg/dL (ref 70–99)
Potassium: 3.9 mmol/L (ref 3.5–5.1)
Sodium: 141 mmol/L (ref 135–145)
Total Bilirubin: 0.3 mg/dL (ref 0.3–1.2)
Total Protein: 5.6 g/dL — ABNORMAL LOW (ref 6.5–8.1)

## 2018-11-05 LAB — CBC
HCT: 43.4 % (ref 36.0–46.0)
Hemoglobin: 14.7 g/dL (ref 12.0–15.0)
MCH: 34.6 pg — ABNORMAL HIGH (ref 26.0–34.0)
MCHC: 33.9 g/dL (ref 30.0–36.0)
MCV: 102.1 fL — ABNORMAL HIGH (ref 80.0–100.0)
Platelets: 201 10*3/uL (ref 150–400)
RBC: 4.25 MIL/uL (ref 3.87–5.11)
RDW: 12 % (ref 11.5–15.5)
WBC: 4.7 10*3/uL (ref 4.0–10.5)
nRBC: 0 % (ref 0.0–0.2)

## 2018-11-05 LAB — SEDIMENTATION RATE: SED RATE: 1 mm/h (ref 0–22)

## 2018-11-05 LAB — PHOSPHORUS: Phosphorus: 3.5 mg/dL (ref 2.5–4.6)

## 2018-11-05 LAB — LIPASE, BLOOD: Lipase: 30 U/L (ref 11–51)

## 2018-11-05 LAB — AMYLASE: Amylase: 80 U/L (ref 28–100)

## 2018-11-05 LAB — MAGNESIUM: Magnesium: 2.1 mg/dL (ref 1.7–2.4)

## 2018-11-05 MED ORDER — ENOXAPARIN SODIUM 40 MG/0.4ML ~~LOC~~ SOLN
40.0000 mg | SUBCUTANEOUS | Status: DC
  Administered 2018-11-05 – 2018-11-07 (×3): 40 mg via SUBCUTANEOUS
  Filled 2018-11-05 (×4): qty 0.4

## 2018-11-05 NOTE — Telephone Encounter (Signed)
Patient has agreed to admission per her sports therapist. I have contacted Dr. Denny LevySara Neal who will be admitting patient to [facility] and implementing the eating disorders protocol. Would recommend reaching out to dietitian services to see if Judeth CornfieldStephanie can see her as she takes care of our eating disorder patients on peds. I will be in close contact for assistance as needs arise during admission and plan to see patient Thursday on lunch hour. Please don't hesitate to contact me by phone or text [phone] for questions.

## 2018-11-05 NOTE — Telephone Encounter (Signed)
Spoke with patient, both of her parents, sports therapist and [facility] assistant AD regarding patient medical condition. Advised given very low BMI, low heart rate, dizziness with standing, secondary amenorrhea, recommend medical admission for refeeding and monitoring. Expect admission to be 5-7 days but advised could be longer or shorter. Discussed refeeding syndrome and risks associated; thus, the importance of medical admission. Advised that patient would be following refeeding protocol with monitored snacks and meals. Advised of need for bedrest and then very limited physical activity. Discussed ongoing close monitoring as outpatient once discharged from the hospital. Pt and family agreed to discuss these factors to decide on whether to pursue admission for medical stabilization.

## 2018-11-05 NOTE — H&P (Addendum)
Family Medicine Teaching Service Daily Progress Note Intern Pager: [phone]  Patient name: Janet Hahn Medical record number: [id number] Date of birth: 1996-08-28 Age: 22 y.o. Gender: female  Primary Care Provider: [name], MD Consultants: none Code Status: Full  Pt Overview and Major Events to Date:  12/31 - admit for symptomatic bradycardia   Assessment and Plan: Is a 22 year old female who was admitted to the hospital for symptomatic bradycardia.  Her past medical history is significant for bulimia and anorexia.  Weight loss secondary to bulimia -this appears to be patient's first hospitalization related to an eating disorder.  She has a previous medical history of bulimia and reports that she has never been hospitalized for it.  Her chart shows that she has had a recent 30 pound weight loss in the past 7 months.  She reports it is not currently interfering with her life in any way.  Previous clinic notes reports orthostatic dizziness in addition to amenorrhea and osteoporosis.  She has admitted for concern for refeeding syndrome and symptomatic bradycardia.  On admission her vitals are remarkable for sinus bradycardia.  Admission labs show mildly depressed albumin 3.4, ALT 48, normal hemoglobin with an MCV of 102.  Recent TSH was within normal limits.  No components of her history point to infectious etiology,, recent HIV was nonreactive.  Mildly depressed albumin (3.4 from 4.0 over a period of 4 days), supports significant malnutrition.  This is most likely related to her history of bulimia potentially excessive exercise.  Based on her history it appears that she has not been inducing emesis in recent months but is likely severely restricting her diet.  We will admit inpatient and monitor for refeeding syndrome over a period of several days while providing a strict diet. -Admit to inpatient, admitting Dr. Nori Hahn -Strict diet protocol's, see printed algorithm -Monitor BMP,  phosphorus daily for refeeding syndrome -Admission EKG -Orthostatic vitals -Sitter for eating disorder -Follow-up admission labs: CMP, CBC, ESR, mag, Foss, amylase, lipase, UDS, urine pregnancy screen  Osteopenia Patient had a Z-score of -1.5 on her bone density scan done on 10/29/2018. This is most likely due to inadequate intake of calcium secondary to bulimia and food restriction and activity as patient is a vegan who "cut out all processed foods" and is also a cross country runner for Janet Hahn. - calcium and vitamin D supplementation - Obtain calcium and Vit D labs - see eating plan for problem above  FEN/GI: Diet as per protocol PPx: Lovenox  Disposition: Discharge home pending refeeding without medical complications for at least 3 days.  Subjective:  Janet Hahn is a 22 year old female who presents with a 30 pound weight loss since May 2019.  Her previous medical history is significant for bulimia (on and off since 2017).  Her first experience with an eating disorder was in her sophomore year of college.  At that time she began forcing herself to vomit in order to better control her weight.  She seems to have had multiple episodes of bulimia since that time usually lasting for a month or 2, she has never been hospitalized for bulimia or anorexia.  Before this current episode, the last time that she had forced herself to throw up was about a year ago.  She reported that she had a 1 week stretch in May 2019 where she again forced herself to throw up.  She has not forced her self to vomit since that week in May.  She does not recall any specific  social stressors (relationship or academic) that have led her to feel particularly stressed or focused on her image.  Since May, she has lost a total of 30 pounds. She states a dietary change of cutting out all processed foods is partially responsible for her weight loss.  She states she eats when hungry but does not feel hungry a lot.  Out of concern for  her wellbeing, her coach (collegiate runner at Janet Hahn) and Journalist, newspaper have asked her to be seen for her weight loss.  In the past month, she has had visits with multiple healthcare providers including Janet Hahn and Janet Hahn.  She is found to have symptoms of low energy, amenorrhea, low bone density.  Her significant weight loss also manifested with some orthostatic dizziness.  Following her visit on 1227 she was advised by Janet Hahn to admit herself to the hospital for closer evaluation and monitoring for refeeding syndrome and focusing on her nutrition.  She reports that she has recently been working with a nutritionist who is encouraged her to eat 3 regular meals a day in addition to 2 snacks.  She prefers the vegan though she is willing to eat eggs.  She met with a dietician about 3 weeks ago and tried increasing her intake at that time by increasing food intake to 3 meals per day and 2 snacks. She has been exercise restricted for about 1 week to no activity after being decreased to lower activity for the previous 2 weeks.  Denies any hair loss, changes in nails, fever, chills, chest pain, palpitations, abdominal pain, nausea, vomiting, constipation, diarrhea, blood in the stool, painful urination, no change in urinary frequency, muscle aches or pain,  Increase in "gas" about 3 weeks ago.  Objective: Temp:  [97.5 F (36.4 C)] 97.5 F (36.4 C) (12/31 1702) Pulse Rate:  [56] 56 (12/31 1702) Resp:  [20] 20 (12/31 1702) BP: (99)/(69) 99/69 (12/31 1702) SpO2:  [100 %] 100 % (12/31 1702)  Physical Exam Constitutional:      Comments: Emaciated appearence   HENT:     Head: Normocephalic and atraumatic.     Mouth/Throat:     Mouth: Mucous membranes are moist.     Pharynx: Oropharynx is clear.  Eyes:     Pupils: Pupils are equal, round, and reactive to light.  Neck:     Musculoskeletal: Normal range of motion and neck supple.  Cardiovascular:     Rate and Rhythm: Regular  rhythm. Bradycardia present.     Pulses: Normal pulses.     Heart sounds: Normal heart sounds. No murmur. No gallop.   Pulmonary:     Effort: Pulmonary effort is normal. No respiratory distress.     Breath sounds: Normal breath sounds. No wheezing or rales.  Abdominal:     General: Bowel sounds are normal.     Palpations: Abdomen is soft.     Tenderness: There is no abdominal tenderness.  Musculoskeletal:        General: No swelling.     Right lower leg: No edema.     Left lower leg: No edema.  Lymphadenopathy:     Cervical: No cervical adenopathy.  Skin:    General: Skin is warm and dry.  Neurological:     General: No focal deficit present.     Mental Status: She is alert.     Cranial Nerves: No cranial nerve deficit.  Psychiatric:        Mood and Affect: Mood normal.  Thought Content: Thought content normal.     Laboratory: Recent Labs  Lab 11/01/18 1600  WBC 4.5  HGB 15.6  HCT 44.7  PLT 243   Recent Labs  Lab 11/01/18 1600  NA 142  K 4.0  CL 103  CO2 26  BUN 10  CREATININE 0.71  CALCIUM 9.0  PROT 6.0  BILITOT 0.4  ALKPHOS 70  ALT 47*  AST 25  GLUCOSE 78    Dg Bone Density  Result Date: 10/29/2018 EXAM: DUAL X-RAY ABSORPTIOMETRY (DXA) FOR BONE MINERAL DENSITY IMPRESSION: Referring Physician: Denita Lung Your patient completed a BMD test using Lunar IDXA DXA system ( analysis version: 16 ) manufactured by [employer]. Technologist: WLS PATIENT: Name: Maysen, Bonsignore Patient ID: [id number] Birth Date: [birth year] Height: 67.0 in. Sex: Female Measured: 10/29/2018 Weight: 102.8 lbs. Indications: Amenorrhea, Caucasian, Low Body Weight (783.22) Fractures: None Treatments: None ASSESSMENT: The BMD measured at Lumbar spine is 1.001 g/cm2 with a Z-score of -1.5 . The Z-score is within expected range for age. ISCD recommends using the Z-scores for assessment of pre-menopausal women, men 66 and 44 years of age and children under 12 years of age. The  diagnosis of osteoporosis in these patients should not be made on the basis of densitometric criteria alone.) The scan quality is good. Site      Region     Measured Date Measured Age YA      AM      BMD T-score Z-score AP Spine L1-L4 10/29/2018 21.6 -1.5 -1.5 1.001 g/cm2 DualFemur Total Left 10/29/2018 21.6 -1.1 -1.1 0.872 g/cm2 DualFemur Total Mean 10/29/2018 21.6 -1.0 -1.0 0.884 g/cm2 RECOMMENDATION: All patients should ensure an adequate intake of dietary calcium (1200 mg/d) and vitamin D (800 IU daily) unless contraindicated. FOLLOW-UP: As clinically indicated I have reviewed this report and agree with the above findings. Mark A. Thornton Papas, M.D. [facility] Electronically Signed   By: Lavonia Dana M.D.   On: 10/29/2018 08:31   Matilde Haymaker, MD 11/05/2018, 5:34 PM PGY-1, [place] Intern pager: [phone], text pages welcome  Resident Attestation  I saw and evaluated the patient, performing the key elements of the service. I personally performed or re-performed the history, physical exam, and medical decision making activities of this service and have verified that the service and findings are accurately documented in the note.I developed the management plan that is described in the note, and I agree with the content, with my edits above in red.  Harolyn Rutherford, DO [facility], PGY-2

## 2018-11-05 NOTE — Telephone Encounter (Signed)
TC to patient to discuss need for admission in the setting of her restrictive eating disorder with significant bradycardia and 74% ideal BMI. Discussed potential for refeeding syndrome in this setting and need to more fully assess her body's overall health. She feels she has heard that her EKG is "normal for a runner" and that her bone density is "fine," thus, she is not convinced she is that sick. She is understandably worried about missing school. We discussed that admissions are typically 5-7 days working with a dietitian on refeeding and creating a sustainable outpatient plan. We discussed that veganism is likely not sustainable for this period of restoration and recovery but that I certainly want to negotiate where possible to respect her beliefs. It is clear she has a very poor understanding of her current condition. She will speak to her parents about this and discuss with her therapist who will let me know what her decision regarding admission is.

## 2018-11-06 ENCOUNTER — Other Ambulatory Visit: Payer: Self-pay

## 2018-11-06 ENCOUNTER — Encounter (HOSPITAL_COMMUNITY): Payer: Self-pay | Admitting: *Deleted

## 2018-11-06 DIAGNOSIS — R001 Bradycardia, unspecified: Secondary | ICD-10-CM

## 2018-11-06 LAB — RAPID URINE DRUG SCREEN, HOSP PERFORMED
Amphetamines: NOT DETECTED
Barbiturates: NOT DETECTED
Benzodiazepines: NOT DETECTED
Cocaine: NOT DETECTED
Opiates: NOT DETECTED
Tetrahydrocannabinol: NOT DETECTED

## 2018-11-06 LAB — URINALYSIS, ROUTINE W REFLEX MICROSCOPIC
Bilirubin Urine: NEGATIVE
Glucose, UA: NEGATIVE mg/dL
Hgb urine dipstick: NEGATIVE
Ketones, ur: NEGATIVE mg/dL
Leukocytes, UA: NEGATIVE
Nitrite: NEGATIVE
Protein, ur: NEGATIVE mg/dL
SPECIFIC GRAVITY, URINE: 1.011 (ref 1.005–1.030)
pH: 8 (ref 5.0–8.0)

## 2018-11-06 LAB — BASIC METABOLIC PANEL
Anion gap: 4 — ABNORMAL LOW (ref 5–15)
Anion gap: 6 (ref 5–15)
BUN: 9 mg/dL (ref 6–20)
BUN: 16 mg/dL (ref 6–20)
CO2: 31 mmol/L (ref 22–32)
CO2: 26 mmol/L (ref 22–32)
Calcium: 9.2 mg/dL (ref 8.9–10.3)
Calcium: 8.9 mg/dL (ref 8.9–10.3)
Chloride: 106 mmol/L (ref 98–111)
Chloride: 110 mmol/L (ref 98–111)
Creatinine, Ser: 0.66 mg/dL (ref 0.44–1.00)
Creatinine, Ser: 0.63 mg/dL (ref 0.44–1.00)
GFR calc Af Amer: >60 mL/min (ref >60)
GFR calc non Af Amer: >60 mL/min (ref >60)
GFR calc non Af Amer: >60 mL/min (ref >60)
Glucose, Bld: 81 mg/dL (ref 70–99)
Glucose, Bld: 88 mg/dL (ref 70–99)
POTASSIUM: 3.7 mmol/L (ref 3.5–5.1)
Potassium: 4.1 mmol/L (ref 3.5–5.1)
SODIUM: 142 mmol/L (ref 135–145)
Sodium: 141 mmol/L (ref 135–145)

## 2018-11-06 LAB — PREGNANCY, URINE: PREG TEST UR: NEGATIVE

## 2018-11-06 LAB — PHOSPHORUS
PHOSPHORUS: 4.1 mg/dL (ref 2.5–4.6)
PHOSPHORUS: 3 mg/dL (ref 2.5–4.6)

## 2018-11-06 LAB — MAGNESIUM
Magnesium: 2.1 mg/dL (ref 1.7–2.4)
Magnesium: 2 mg/dL (ref 1.7–2.4)

## 2018-11-06 MED ORDER — POTASSIUM CHLORIDE CRYS ER 20 MEQ PO TBCR
40.0000 meq | EXTENDED_RELEASE_TABLET | Freq: Once | ORAL | Status: AC
  Administered 2018-11-06: 40 meq via ORAL
  Filled 2018-11-06: qty 2

## 2018-11-06 NOTE — Progress Notes (Signed)
Patient is yet to receive dinner tray at 1756.  Nurse called kitchen around 1710 and was told patient's dinner tray was on the way.  Patient in her room eating bagel and peanut butter that her mother brought.  Patient requested nurse for raisin bran cereal and nurse provided.  Nurse called food service to request soy milk for patient to add to her cereal. Patient's mother is at bedside. Elnita Maxwell, RN

## 2018-11-06 NOTE — Progress Notes (Signed)
Nursing leadership made call to [facility] and Pediatric unit for clarification on the protocol for medical treatment of eating disorders. Protocol was explained by pediatrics and necessary documents will be sent to the unit as resources.

## 2018-11-06 NOTE — Progress Notes (Signed)
Patient asleep on right side. Respirations even and unlabored. Sitter at bedside.

## 2018-11-06 NOTE — Progress Notes (Signed)
Received call that family was upset and would like to speak with hospital supervisor over concerns as to whether or not the patient is allowed outside food/drink.  After speaking with [facility] staff and on-call dietitian, Anda Kraft, the gap was identified that nursing staff were attempting to adhere to the pediatric eating disorder protocol as there are no protocols addressing adult inpatient care for eating disorders. Per MD note on 12/31 and RD - goals of care are to maintain electrolyte balance, heartrate control and optimize oral intake. Anda Kraft, [place] also spoke with her supervisor to assure optimal patient care was being provided.  The following plan of care for patient has been decided: -Allow outside food/drink as long as nursing staff are made aware so that the food may be documented for caloric intake monitoring -Patient may ambulate and no longer on strict bedrest unless indicated otherwise by MD orders  Registered Dietitian will meet with patient and family before lunch tomorrow.  Met with family and patient, discussed plan and provided service recovery. Family very appreciative of clarification and follow up.   Also discussed plan with primary RN and unit leadership.

## 2018-11-06 NOTE — Progress Notes (Signed)
Family Medicine Teaching Service Daily Progress Note Intern Pager: [phone]  Patient name: Janet Hahn Medical record number: [id number] Date of birth: [birth year] Age: 23 y.o. Gender: female  Primary Care Provider: Mliss Sax, MD Consultants: None Code Status: Full  Pt Overview and Major Events to Date:  Admitted to FPTS on 11/05/18 for symptomatic bradycardia   Assessment and Plan: Is a 23 year old female who was admitted to the hospital for symptomatic bradycardia.  Her past medical history is significant for bulimia and anorexia.  Weight loss and bradycardia secondary to bulimia  Currently bradycardic with HR to 39. HR 50 on manual read. 30 pound weight loss in the past 7 months. Previous clinic notes reports orthostatic dizziness in addition to amenorrhea and osteoporosis. Mildly depressed albumin (3.4 from 4.0 over a period of 4 days), supports significant malnutrition. AM BMP wnl. AM EKG showing sinus bradycardia with QTc 504. Previous EKG from 11/04/18 showing QTc of 474. -Strict diet protocol's, see printed algorithm -Monitor BMP, phosphorus daily for refeeding syndrome -Orthostatic vitals wnl -Sitter for eating disorder -daily EKG  Osteopenia Likely 2/2 inadequate intake of Ca. Patient had a Z-score of -1.5 on her bone density scan done on 10/29/2018.  - calcium and vitamin D supplementation - Obtain calcium and Vit D labs - see eating plan for problem above  FEN/GI: Diet as per protocol PPx: Lovenox  Disposition: continued inpatient stay for bradycardia   Subjective:  Patient states she is doing well. Has not received her breakfast tray but discussed with RN who stated she would record how much patient ate of tray and would relay information to primary team. Per RN entire meal tray was eaten and patient also had 1C coffee. Patient denies CP or SOB. Denies palpitations.   Objective: Temp:  [97.5 F (36.4 C)-97.8 F (36.6 C)] 97.6 F (36.4 C) (01/01  0459) Pulse Rate:  [39-102] 39 (01/01 0459) Resp:  [18-20] 18 (01/01 0459) BP: (99-104)/(58-69) 103/58 (01/01 0459) SpO2:  [95 %-100 %] 100 % (01/01 0459) Weight:  [46 kg-47.7 kg] 46 kg (01/01 0459) Physical Exam: General: awake and alert, watching television, NAD  Cardiovascular: bradycardic, regular rhythm, no MRG. Radial pulses 2+ with HR of 50 on manual read  Respiratory: CTAB, no wheezes, rales, or rhonchi  Abdomen: soft, non tender, non distended, bowel sounds normal  Extremities: non tender, no edema   Laboratory: Recent Labs  Lab 11/01/18 1600 11/05/18 1955  WBC 4.5 4.7  HGB 15.6 14.7  HCT 44.7 43.4  PLT 243 201   Recent Labs  Lab 11/01/18 1600 11/05/18 1955 11/06/18 0521  NA 142 141 141  K 4.0 3.9 3.7  CL 103 104 106  CO2 26 30 31   BUN 10 8 9   CREATININE 0.71 0.68 0.66  CALCIUM 9.0 9.0 9.2  PROT 6.0 5.6*  --   BILITOT 0.4 0.3  --   ALKPHOS 70 46  --   ALT 47* 48*  --   AST 25 29  --   GLUCOSE 78 98 81    Ref. Range 11/06/2018 05:21  Magnesium Latest Ref Range: 1.7 - 2.4 mg/dL 2.1    Ref. Range 11/06/2018 05:21  Phosphorus Latest Ref Range: 2.5 - 4.6 mg/dL 4.1    Imaging/Diagnostic Tests: Dg Bone Density  Result Date: 10/29/2018 EXAM: DUAL X-RAY ABSORPTIOMETRY (DXA) FOR BONE MINERAL DENSITY IMPRESSION: Referring Physician: Ronnald Nian Your patient completed a BMD test using Lunar IDXA DXA system ( analysis version: 16 ) manufactured by [employer]. Technologist: WLS PATIENT: Name: Janet Hahn, Janet Hahn Patient ID: [id number] Birth Date: [birth year] Height: 67.0 in. Sex: Female Measured: 10/29/2018 Weight: 102.8 lbs. Indications: Amenorrhea, Caucasian, Low Body Weight (783.22) Fractures: None Treatments: None ASSESSMENT: The BMD measured at Lumbar spine is 1.001 g/cm2 with a Z-score of -1.5 . The Z-score is within expected range for age. ISCD recommends using the Z-scores for assessment of pre-menopausal women, men 95 and 85 years of age and children under 82  years of age. The diagnosis of osteoporosis in these patients should not be made on the basis of densitometric criteria alone.) The scan quality is good. Site      Region     Measured Date Measured Age YA      AM      BMD T-score Z-score AP Spine L1-L4 10/29/2018 21.6 -1.5 -1.5 1.001 g/cm2 DualFemur Total Left 10/29/2018 21.6 -1.1 -1.1 0.872 g/cm2 DualFemur Total Mean 10/29/2018 21.6 -1.0 -1.0 0.884 g/cm2 RECOMMENDATION: All patients should ensure an adequate intake of dietary calcium (1200 mg/d) and vitamin D (800 IU daily) unless contraindicated. FOLLOW-UP: As clinically indicated I have reviewed this report and agree with the above findings. Mark A. Tyron Russell, M.D. [facility] Radiology Electronically Signed   By: Ulyses Southward M.D.   On: 10/29/2018 08:31     Oralia Manis, DO 11/06/2018, 9:26 AM PGY-2, [place] [facility] FPTS Intern pager: [phone], text pages welcome

## 2018-11-06 NOTE — Progress Notes (Addendum)
Nurse called to patient's room by patient's dad. Patient has not received her lunch tray. Patient expresses concern that her meals are not being delivered on time.  Nurse asked patient the times she would like to receive her trays.  Nurse called kitchen and requested patient's trays to be delivered at 0800, 1200, and 1700 per patient request.  Patient also concerned she has not seen a dietician yet.  Nurse will follow up with dietician.  Elnita Maxwell, RN   11/06/18 1307:  Dietician on call talked to this nurse and also talked to patient's mother regarding patient's food preferences.  Elnita Maxwell, RN

## 2018-11-06 NOTE — Progress Notes (Signed)
Patient had uneventful night. She rested well and no issues with the sitter or the patient.

## 2018-11-07 ENCOUNTER — Other Ambulatory Visit: Payer: Self-pay

## 2018-11-07 DIAGNOSIS — E441 Mild protein-calorie malnutrition: Secondary | ICD-10-CM | POA: Diagnosis present

## 2018-11-07 DIAGNOSIS — R9431 Abnormal electrocardiogram [ECG] [EKG]: Secondary | ICD-10-CM

## 2018-11-07 DIAGNOSIS — R634 Abnormal weight loss: Secondary | ICD-10-CM

## 2018-11-07 LAB — PHOSPHORUS
PHOSPHORUS: 4.4 mg/dL (ref 2.5–4.6)
Phosphorus: 3.7 mg/dL (ref 2.5–4.6)

## 2018-11-07 LAB — BASIC METABOLIC PANEL
Anion gap: 5 (ref 5–15)
Anion gap: 8 (ref 5–15)
BUN: 16 mg/dL (ref 6–20)
BUN: 14 mg/dL (ref 6–20)
CO2: 29 mmol/L (ref 22–32)
CO2: 26 mmol/L (ref 22–32)
Calcium: 9 mg/dL (ref 8.9–10.3)
Calcium: 8.7 mg/dL — ABNORMAL LOW (ref 8.9–10.3)
Chloride: 108 mmol/L (ref 98–111)
Chloride: 106 mmol/L (ref 98–111)
Creatinine, Ser: 0.72 mg/dL (ref 0.44–1.00)
Creatinine, Ser: 0.67 mg/dL (ref 0.44–1.00)
GFR calc Af Amer: >60 mL/min (ref >60)
GFR calc non Af Amer: >60 mL/min (ref >60)
GFR calc non Af Amer: >60 mL/min (ref >60)
Glucose, Bld: 85 mg/dL (ref 70–99)
Glucose, Bld: 91 mg/dL (ref 70–99)
Potassium: 4.7 mmol/L (ref 3.5–5.1)
Potassium: 3.8 mmol/L (ref 3.5–5.1)
Sodium: 142 mmol/L (ref 135–145)
Sodium: 140 mmol/L (ref 135–145)

## 2018-11-07 LAB — MAGNESIUM
Magnesium: 1.9 mg/dL (ref 1.7–2.4)
Magnesium: 2.5 mg/dL — ABNORMAL HIGH (ref 1.7–2.4)

## 2018-11-07 LAB — GLUCOSE, CAPILLARY: Glucose-Capillary: 162 mg/dL — ABNORMAL HIGH (ref 70–99)

## 2018-11-07 LAB — FOLLICLE STIMULATING HORMONE: FSH: 2.7 m[IU]/mL

## 2018-11-07 LAB — VITAMIN D 25 HYDROXY (VIT D DEFICIENCY, FRACTURES): Vit D, 25-Hydroxy: 34.2 ng/mL (ref 30.0–100.0)

## 2018-11-07 LAB — PROLACTIN: Prolactin: 19.6 ng/mL (ref 4.8–23.3)

## 2018-11-07 LAB — VITAMIN B12: Vitamin B-12: 812 pg/mL (ref 180–914)

## 2018-11-07 LAB — LUTEINIZING HORMONE: LH: <0.2 m[IU]/mL

## 2018-11-07 MED ORDER — ADULT MULTIVITAMIN W/MINERALS CH
1.0000 | ORAL_TABLET | Freq: Every day | ORAL | Status: DC
  Administered 2018-11-07 – 2018-11-09 (×3): 1 via ORAL
  Filled 2018-11-07 (×3): qty 1

## 2018-11-07 MED ORDER — BOOST / RESOURCE BREEZE PO LIQD CUSTOM
1.0000 | Freq: Three times a day (TID) | ORAL | Status: DC
  Administered 2018-11-07 – 2018-11-08 (×2): 1 via ORAL

## 2018-11-07 MED ORDER — MAGNESIUM SULFATE 50 % IJ SOLN: 1.0000 g | Freq: Once | INTRAMUSCULAR | Status: DC

## 2018-11-07 MED ORDER — MAGNESIUM SULFATE IN D5W 1-5 GM/100ML-% IV SOLN
1.0000 g | Freq: Once | INTRAVENOUS | Status: AC
  Administered 2018-11-07: 1 g via INTRAVENOUS
  Filled 2018-11-07: qty 100

## 2018-11-07 NOTE — Progress Notes (Addendum)
Initial Nutrition Assessment  DOCUMENTATION CODES:   Non-severe (moderate) malnutrition in context of acute illness/injury, Underweight  INTERVENTION:   -MVI with minerals daily -Boost Breeze po TID, each supplement provides 250 kcal and 9 grams of protein -Snacks TID between meals -RD to order meals personally based upon pt preferences; RD entered meals to be scheduled to be delivered in the [facility] System at 0800, 1200, and 1700 daily per pt request -Manager's check with all meals -Pt family to provide outside foods to assist with PO intake and pt preferences -Pt can also request additional snacks/ supplements from unit floorstock  NUTRITION DIAGNOSIS:   Moderate Malnutrition related to chronic illness(anorexia and bulimia) as evidenced by energy intake < 75% for > or equal to 1 month, mild fat depletion, moderate fat depletion, mild muscle depletion, moderate muscle depletion.  GOAL:   Patient will meet greater than or equal to 90% of their needs  MONITOR:   PO intake, Supplement acceptance, Labs, Weight trends, Skin, I & O's  REASON FOR ASSESSMENT:   Consult Assessment of nutrition requirement/status  ASSESSMENT:   23 year old female who was admitted to the hospital for symptomatic bradycardia.  Her past medical history is significant for bulimia and anorexia.  Pt admitted with symptomatic bradycardia and weight loss secondary to bulimia.   Case discussed with on-call RD and RN prior to visit. Per RN, pt has been consuming 100% of meals and outside food- consumed 100% of veggie burger, bagel with peanut butter, and raisin bran with soy milk yesterday. Electrolytes have been stable since admission.   Spoke with pt, father, and mother at bedside. All express concern regarding limited food options available to pt due to food preferences and how pt will meet calorie goals. Pt reports she was formerly vegan, but is now vegetarian to assist in obtaining  adequate nutrition. Pt's favorite food include quinoa and is agreeable to eggs at breakfast for protein. RD spent extensive time with pt and family reviewing available items that pt was amenable to eat. Pt's biggest anxieties are meal tray content and arrival of meals so that she can plan outside foods and snacks accordingly. Pt and family requesting that meals and snacks be scheduled to help decrease anxiety of when foods will arrive. Additionally, pt and family agreeable to set meal plan throughout hospitalization to make meals and snacks more predictable- pt will receive the same meal plan daily to help pt and family plan outside food and request additional snacks from unit floorstock accordingly.   Per pt, she last saw outpatient RD approximately 3 weeks ago for initial consultation and has an extensive meal planning appointment scheduled for 11/11/17. Nutritional supplements were not recommended at initial outpatient RD, however, pt reports that they were planning on possibly introducing some at next appointment. Pt tried Ensure yesterday, however, did not tolerate well ("made me cramp up, like most lactose products"); she is amenable to try Boost Breeze. Pt is motivated to eat and amenable to RD recommendations. 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  Per wt hx, pt has experienced a 2.9% wt loss over the past month, which is not significant for time frame. Pt with notable mild to moderate fat and muscle depletions, however, pt is also slender framed at baseline (pt mother and father also with tall, slender body types).   Spent majority of visit coordinating meals, supplements, and snacks with pt. Meal plan  is as follows:   Breakfast (0800): black coffee, scrambled eggs, oatmeal, banana, english muffin with peanut butter, soy milk (695 kcals, 30 grams protein) Snack (1000): Raisin bran and soy milk (240 kcals, 9 grams protein) Lunch  (1200): soy milk, peanut butter sandwich on 2 slices of wheat bread (553 kcals, 19 grams protein) Snack (1400): Apple and peanut butter (250 kcals, 5 grams protein) Dinner (1700): veggie burger, mustard, soy milk, carrots, and rice (459 kcals, 18 grams protein) Snack (2000): Cheerios and soy milk (200 kcals, 8 grams protein)  Meals, snacks, and supplements will provide a total of 3147 kcals and 116 grams protein (meeting >100% of estimated kcal needs and 100% of estimated protein needs) provided pt consumes 100% of meals, snacks, and supplements.   Case discussed with RN and nursing unit director regarding interventions and plan of care. RD spent approximately 3 hours obtaining history, meal preferences, and coordinating care of patient.   ADDENDUM (1316): Discussed nutrition care plan with adolescent medicine NP regarding nutrition care plan. Per NP, case has been discussed with outpatient RD that pt has been following and ultimate goal calorie goal for pt is 2600 kcals. Per pt and family, pt has scheduled outpatient RD appointment on 11/11/17 for extensive meal planning. NP reports that pt will likely be discharged prior to appointment.   Labs reviewed.   NUTRITION - FOCUSED PHYSICAL EXAM:    Most Recent Value  Orbital Region  Moderate depletion  Upper Arm Region  Mild depletion  Thoracic and Lumbar Region  Moderate depletion  Buccal Region  No depletion  Temple Region  No depletion  Clavicle Bone Region  Moderate depletion  Clavicle and Acromion Bone Region  Mild depletion  Scapular Bone Region  Mild depletion  Dorsal Hand  No depletion  Patellar Region  Mild depletion  Anterior Thigh Region  No depletion  Posterior Calf Region  Mild depletion  Edema (RD Assessment)  None  Hair  Reviewed  Eyes  Reviewed  Mouth  Reviewed  Skin  Reviewed  Nails  Reviewed       Diet Order:   Diet Order            Diet regular Room service appropriate? No; Fluid consistency: Thin  Diet effective  now              EDUCATION NEEDS:   Education needs have been addressed  Skin:  Skin Assessment: Reviewed RN Assessment  Last BM:  11/04/18  Height:   Ht Readings from Last 1 Encounters:  11/05/18 5' 6.5" (1.689 m)    Weight:   Wt Readings from Last 1 Encounters:  11/07/18 46.3 kg    Ideal Body Weight:  60.5 kg  BMI:  Body mass index is 16.23 kg/m.  Estimated Nutritional Needs:   Kcal:  1900-2100  Protein:  100-115 grams  Fluid:  > 1.9 L    Lonnette Shrode A. Mayford KnifeWilliams, RD, LDN, CDE Pager: [phone] After hours Pager: [phone]

## 2018-11-07 NOTE — Progress Notes (Deleted)
Brief Nutrition Note  Consult received for enteral/tube feeding initiation and management.  Adult Enteral Nutrition Protocol initiated. Full follow-up assessment to follow. Pt initially assessed on 12/30, now ventilated.   Admitting Dx: sympotamatic bradycardia  Body mass index is 16.23 kg/m.   Labs:  Recent Labs  Lab 11/06/18 1640 11/07/18 0409 11/07/18 1431  NA 142 142 140  K 4.1 4.7 3.8  CL 110 108 106  CO2 26 29 26   BUN 16 16 14   CREATININE 0.63 0.72 0.67  CALCIUM 8.9 9.0 8.7*  MG 2.0 1.9 2.5*  PHOS 3.0 4.4 3.7  GLUCOSE 88 85 [address] MS, RD, LDN, CNSC [phone] Pager  [phone] Weekend/On-Call Pager

## 2018-11-07 NOTE — Progress Notes (Signed)
Patient complained of feeling dizzy and nauseated after IV magnesium administration.  Patient laying in bed, parents at bedside. VS within patient baseline. MD notified.  Per MD patient to stay in bed. Patient having a snack at this time.  Patient will let nurse know if nausea is still present after eating her snack.  Elnita Maxwell, RN

## 2018-11-07 NOTE — Consult Note (Signed)
Adolescent Medicine Consultation Janet Hahn  is a 23 y.o. female admitted for symptomatic bradycardia, moderate malnutrition, monitoring for refeeding syndrome.      PCP Confirmed?  yes  Mliss Sax, MD   History was provided by the patient and father.  Chart review:  Pt has lost about 30 pounds since May. At that time, that was about her heaviest college weight, but she was happy at that weight and was successfully running cross country. Bone density with spine -1.5 SD. EKG with marked bradycardia. Small elevation in LFTs. Otherwise stable labs. Admitted for monitoring and refeeding.   Last STI screen: None- will obtain in clinic Pertinent Labs: Lytes stable inpatient.   HPI:  Pt reports that things have been somewhat challenging over the last 24 hours in terms of getting meal trays on time and knowing what and how much to eat. This is also what she struggled with at home. Reports that her weight loss started after she cut out all processed foods. She has been vegan for 2 years and was able to maintain weight for that time, but was "binging" on processed foods like cereal and would feel bad about herself after, so decided to stop eating these things.   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 She had her mileage cut down a few weeks ago and has been on no exercise for the last week. Although this is difficult, she understands the necessity to improve her overall health status.   She denies ever having had anxiety or depression or taken any medications for mood. Denies any family history of mood or eating disorders. She has a history freshman/sophomore year of developing significant bulimia after an XC injury left her unable to participate for a time. She reports having recovered for a period of time, although she struggled with a  relapse around spring of 2019. During her severe bulimia in 2017 is when her menstrual cycle stopped and has not returned since. She is not taking any oral contraceptive medication and denies current sexual activity.   Denies any current concerns of dizziness, headaches, abdominal pain, bloating or constipation. Reports she is hungry and willing to eat what is necessary to help her get better within the confines of vegetarianism and dairy free. Has struggled with dairy x 3 years (bloating, diarrhea). Also prefers no tomatoes or onions- causes stomach upset.    Outpatient eating disorders specialist RD Danise Edge recommendations for Friday and Saturday meals to meet at least 1800 kcal. Goal prior to discharge would be 2600 kcal. Has Monday RD appt scheduled. She is keeping log of intake.    Friday:  B: oatmeal, 2 scrambled eggs, lactaid or soymilk, fresh fruit L: veggie burger on bun, lactaid, rice, 2 servings of broccoli  D: [employer] mediterranean grain bowl OR baja grain bowl (no cheese), lactaid/soymilk, cookie  Saturday:  B: pancakes or french toast with 2 eggs and lactaid/soymilk, fresh fruit  L: PBJ sandwich, 2 servings green beans, lactaid/soymilk  D: [employer] avocado, egg and spinach on multigrain or everything bagel OR modern greek salad with quinoa (no cheese), cookie  Sunday:  B: banana nut muffin, lactaid/soymilk, 2 eggs  L: Pick a panera option above  D: Pick a panera option above    Snacks: macro, luna or cliff bar, graham crackers and PB with lactaid,  2 bowls of raisin bran with lactaid, apple and PB with lactaid  If she can't complete any of her meals:  0-25%- 2 boost breeze 25-50%- 1.5 boost breeze 50-75%- 1 boost breeze  75-100%- 0.5 boost breeze   If snack is incomplete or unavailable, 1 boost breeze.   Social [place]: [place], but are at bedside with her currently. 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   Physical Exam:  Vitals:   11/06/18 0459 11/06/18 1459 11/06/18 2137 11/07/18 0608  BP: (!) 103/58 (!) 105/57 100/68 104/63  Pulse: (!) 39 (!) 59 (!) 44 (!) 53  Resp: 18  16 18   Temp: 97.6 F (36.4 C) 98 F (36.7 C) 97.6 F (36.4 C) (!) 97.5 F (36.4 C)  TempSrc: Oral Oral Oral Oral  SpO2: 100% 99% 100% 100%  Weight: 46 kg   46.3 kg  Height:       BP 104/63   Pulse (!) 53   Temp (!) 97.5 F (36.4 C) (Oral)   Resp 18   Ht 5' 6.5" (1.689 m)   Wt 46.3 kg   LMP 09/06/2016   SpO2 100%   BMI 16.23 kg/m  Body mass index: body mass index is 16.23 kg/m. Growth percentile SmartLinks can only be used for patients less than 22 years old.  Physical Exam  Constitutional: She is oriented to person, place, and time. She appears well-developed and well-nourished.  HENT:  Head: Normocephalic.  Neck: No thyromegaly present.  Cardiovascular: Normal rate, regular rhythm, normal heart sounds and intact distal pulses.  Pulmonary/Chest: Effort normal and breath sounds normal.  Abdominal: Soft. Bowel sounds are normal. There is no abdominal tenderness.  Musculoskeletal: Normal range of motion.  Neurological: She is alert and oriented to person, place, and time.  Skin: Skin is warm and dry.  Carotenemia   Psychiatric: She has a normal mood and affect.     Assessment/Plan: 1. Weight loss/disordered eating  Goal per outpatient RD is to get to 2600 kcal daily. I would like to push to this fairly quickly given that she is under hospital monitoring for electrolyte and cardiac disturbances so we can ensure she will tolerate this volume of food. She reports being comfortable with this plan. Meal plans above provide 1800-2000 kcal in B, L and D and 3 snacks should provide additional kcal to get to 2600/day. She is keeping track of what she is eating. Explained protocol with boost breeze if she can't finish/doesn't like something she gets, which she is amenable to. Volumes as  above. She has outpt RD appt on Monday that we would ideally get her to if HR is improved to 45 daytime, 40 night, and elytes stable.   2. Bradycardia  Goal 45 daytime, 40 nighttime as an average. She is asymptomatic today and her orthostatic vitals on admission were reassuring. QTc is prolonged on EKG- primary team will continue to monitor.   3. Secondary amenorrhea  Bone density consistent with hypoestrogen state. We talked about the risks of stress fracture. Continue calcium 600 mg daily and vit d 2000 IU daily as well as MVI. Would be helpful to get estradiol level while she is inpatient getting lab draws.   Disposition Plan:  I have scheduled her for outpatient f/u in our clinic Wednesday 1/8 presuming she is able to be discharged prior to then. Please don't hesitate to call/text with questions- [phone].   Medical decision-making:  >  45 minutes spent, more than 50% of appointment was spent discussing diagnosis and management of symptoms

## 2018-11-07 NOTE — Discharge Summary (Signed)
Family Medicine Teaching [facility] Discharge Summary  Patient name: Janet Hahn Medical record number: [id number] Date of birth: 02/21/1996 Age: 23 y.o. Gender: female Date of Admission: 11/05/2018  Date of Discharge: 11/09/2018 Admitting Physician: Nestor RampSara L Neal, MD  Primary Care Provider: Mliss SaxKremer, William Alfred, MD Consultants: Nutrition   Indication for Hospitalization: symptomatic bradycardia   Discharge Diagnoses/Problem List:  Weight loss and bradycardia 2/2 bulimia  Prolonged QTc  Osteopenia   Disposition: discharge gome  Discharge Condition: improved, stable  Discharge Exam:  General: well nourished, well developed, in no acute distress with non-toxic appearance HEENT: normocephalic, atraumatic, moist mucous membranes Neck: supple, non-tender without lymphadenopathy CV: regular rate and rhythm without murmurs, rubs, or gallops, no lower extremity edema Lungs: clear to auscultation bilaterally with normal work of breathing Abdomen: soft, non-tender, non-distended, no masses or organomegaly palpable, normoactive bowel sounds Skin: warm, dry, no rashes or lesions Extremities: warm and well perfused, normal tone MSK: ROM grossly intact, strength intact, gait normal Neuro: Alert and oriented, speech normal  Brief Hospital Course:  Janet Hahn is a 23 y.o. female presenting for symptomatic bradycardia and weight loss. Patient was admitted as a direct admit after 30lb weight loss since May 2019. During admission disordered eating protocol was followed, patient to have regular meals with 1800cal with goal of 2600 cal. Patient to have snacks TID. Meals were monitored by sitter. Patient followed feeding plan throughout admission without need for a sitter >24 hours prior to discharge. During admission labs were monitored closely to ensure no re-feeding syndrome and were repleted as needed.   Prior to discharge they were meeting all goals including HR >45 while awake and  HR>40 while asleep, normalization of BMP,  no symptomatic orthostasis, and tolerating activity. By day of discharge patient had improved vital signs while awake and asleep, negative orthostatic vital signs, and stabilization of electrolyte abnormalities. On day of discharge patient had gained a total of 363grams since admission. Return precautions were discussed and close follow-up arranged.  EKG on 1/1 showed prolonged QTc of 504. Daily EKGs were obtained and showed improvement. As stated above, electrolytes were closely monitored and repleted as necessary. Patient's bradycardia improved throughout admission, and at time of discharge, vitals were stable for >24 hours with normal orthostatics.  Work-up during admission also included calcium and vitamin D levels for osteopenia secondary to z-score of -1.5 on bone density measured on 12/24. Patient was started on Vitamin D and calcium supplimentation during her hospitalization.   Issues for Follow Up:  1. Ensure close follow up with nutrition and adolescent clinic  2. Monitor HR for bradycardia and prolonged QTc 3. Follow up on bone density  Significant Procedures: None  Significant Labs and Imaging:  Recent Labs  Lab 11/05/18 1955  WBC 4.7  HGB 14.7  HCT 43.4  PLT 201   Recent Labs  Lab 11/05/18 1955 11/06/18 0521 11/06/18 1640 11/07/18 0409 11/07/18 1431 11/08/18 0553  NA 141 141 142 142 140 141  K 3.9 3.7 4.1 4.7 3.8 4.3  CL 104 106 110 108 106 105  CO2 30 31 26 29 26 30   GLUCOSE 98 81 88 85 91 89  BUN 8 9 16 16 14 13   CREATININE 0.68 0.66 0.63 0.72 0.67 0.76  CALCIUM 9.0 9.2 8.9 9.0 8.7* 9.0  MG 2.1 2.1 2.0 1.9 2.5* 2.1  PHOS 3.5 4.1 3.0 4.4 3.7 4.4  ALKPHOS 46  --   --   --   --   --  AST 29  --   --   --   --   --   ALT 48*  --   --   --   --   --   ALBUMIN 3.4*  --   --   --   --   --     Results/Tests Pending at Time of Discharge:  Unresulted Labs (From admission, onward)    Start     Ordered   11/08/18 0500   Estradiol  Tomorrow morning,   R     11/07/18 1837   11/06/18 0500  Basic metabolic panel  Daily,   R     11/05/18 2208   11/06/18 0500  Magnesium  Daily,   R     11/05/18 2208   11/06/18 0500  Phosphorus  Daily,   R     11/05/18 2208          Discharge Medications:  Allergies as of 11/09/2018      Reactions   Lactose Intolerance (gi) Anaphylaxis   Gas Stomach cramping      Medication List    TAKE these medications   calcium citrate-vitamin D 315-200 MG-UNIT tablet Commonly known as:  CITRACAL+D Take 2 tablets by mouth daily.   multivitamin with minerals Tabs tablet Take 1 tablet by mouth daily.       Discharge Instructions: Please refer to Patient Instructions section of EMR for full details.  Patient was counseled important signs and symptoms that should prompt return to medical care, changes in medications, dietary instructions, activity restrictions, and follow up appointments.   Follow-Up Appointments: Follow-up Information    [facility] Follow up.   Why:  Go to appointment as scheduled on Wednesday 1/8 Contact information: [address], [address] [place] [address] [phone]          Leeroy Bock, DO 11/09/2018, 2:53 PM PGY-1, [facility]

## 2018-11-07 NOTE — Progress Notes (Addendum)
Family Medicine Teaching Service Daily Progress Note Intern Pager: [phone]  Patient name: Janet Hahn Medical record number: [id number] Date of birth: 04/24/1996 Age: 23 y.o. Gender: female  Primary Care Provider: Mliss Sax, MD Consultants: Nutrition  Code Status: Full  Pt Overview and Major Events to Date:  Admitted to FPTS on 11/05/18 for symptomatic bradycardia   Assessment and Plan: Is a 23 year old female who was admitted to the hospital for symptomatic bradycardia. Her past medical history is significant for bulimia and anorexia.  Weight loss and bradycardia secondary tobulimia Improving. Current HR 53, ON 44. Goal HR of 40 while sleeping and 45 during day. AM EKG showing sinus bradycardia (HR 39 bpm) with improved QTC of 494. AM BMP wnl. B12 wnl. Wt improved to 102.1lb from 101.5 on 11/06/18. Have clarified diet protocol with RN who stated that dietician informed her that she will personally check to make sure meals are ordered correctly and delivered on time.  -Strict diet protocol's, see printed algorithm -Monitor BMP, phosphorus daily for refeeding syndrome -Orthostatic vitals wnl -Sitter for eating disorder -daily EKG  Prolonged QTc Patient with Qtc of 504 on 11/06/18, previous EKG from 12/30 showing QTc of 474. AM EKG improving with QTc of 494. Goal K >4, Magnesium >2, phosphorous near ULN. AM labs showing K 4.7, Mg 1.9, Phos 4.4.  -daily EKG -daily BMP, Mg, Phos -replete electrolytes as needed   Osteopenia Likely 2/2 inadequate intake of Ca. Patient had a Z-score of -1.5 on her bone density scan done on 10/29/2018.  - calcium and vitamin D supplementation - see eating plan for problem above  FEN/GI:Diet as per protocol DHR:CBULAGT  Disposition: continued admission for bradycardia   Subjective:  Patient states she feels well. Was upset that she was not seen by dietician yesterday and did not receive her meal trays in time. Per RN notes, which  patient and her mother also confirm, nursing supervisor was notified and trays will now be delivered on time. Dietician to see patient today.   Spoke with Alfonso Ramus, NP, on the phone to discuss patient. Per Alfonso Ramus patient's outpatient dietician discussed with her that patient should start at 1800 cal diet with goal of 2600 cal/day by end of hospital stay.   Objective: Temp:  [97.5 F (36.4 C)-98 F (36.7 C)] 97.5 F (36.4 C) (01/02 3646) Pulse Rate:  [44-59] 53 (01/02 0608) Resp:  [16-18] 18 (01/02 8032) BP: (100-105)/(57-68) 104/63 (01/02 1224) SpO2:  [99 %-100 %] 100 % (01/02 8250) Weight:  [46.3 kg] 46.3 kg (01/02 0370) Physical Exam: General: awake and alert, sitting up in bed with mother at bedside, NAD Cardiovascular: RRR, 2+ radial pulses measured at 60, no MRG  Respiratory: CTAB, no wheezes, rales, or rhonchi  Abdomen: soft, non tender, non distended, bowel sounds normal  Extremities: non tender, no edema   Laboratory: Recent Labs  Lab 11/01/18 1600 11/05/18 1955  WBC 4.5 4.7  HGB 15.6 14.7  HCT 44.7 43.4  PLT 243 201   Recent Labs  Lab 11/01/18 1600  11/05/18 1955 11/06/18 0521 11/06/18 1640 11/07/18 0409  NA 142   < > 141 141 142 142  K 4.0  --  3.9 3.7 4.1 4.7  CL 103  --  104 106 110 108  CO2 26  --  30 31 26 29   BUN 10   < > 8 9 16 16   CREATININE 0.71  --  0.68 0.66 0.63 0.72  CALCIUM 9.0  --  9.0 9.2 8.9 9.0  PROT 6.0  --  5.6*  --   --   --   BILITOT 0.4  --  0.3  --   --   --   ALKPHOS 70  --  46  --   --   --   ALT 47*  --  48*  --   --   --   AST 25  --  29  --   --   --   GLUCOSE 78   < > 98 81 88 85   < > = values in this interval not displayed.     Ref. Range 11/07/2018 04:09  Phosphorus Latest Ref Range: 2.5 - 4.6 mg/dL 4.4  Magnesium Latest Ref Range: 1.7 - 2.4 mg/dL 1.9    Ref. Range 11/07/2018 04:09  Vitamin B12 Latest Ref Range: 180 - 914 pg/mL 812   Imaging/Diagnostic Tests: Dg Bone Density  Result Date:  10/29/2018 EXAM: DUAL X-RAY ABSORPTIOMETRY (DXA) FOR BONE MINERAL DENSITY IMPRESSION: Referring Physician: Ronnald Nian Your patient completed a BMD test using Lunar IDXA DXA system ( analysis version: 16 ) manufactured by [employer]. Technologist: WLS PATIENT: Name: Janet Hahn Patient ID: [id number] Birth Date: 1996/05/11 Height: 67.0 in. Sex: Female Measured: 10/29/2018 Weight: 102.8 lbs. Indications: Amenorrhea, Caucasian, Low Body Weight (783.22) Fractures: None Treatments: None ASSESSMENT: The BMD measured at Lumbar spine is 1.001 g/cm2 with a Z-score of -1.5 . The Z-score is within expected range for age. ISCD recommends using the Z-scores for assessment of pre-menopausal women, men 44 and 60 years of age and children under 68 years of age. The diagnosis of osteoporosis in these patients should not be made on the basis of densitometric criteria alone.) The scan quality is good. Site      Region     Measured Date Measured Age YA      AM      BMD T-score Z-score AP Spine L1-L4 10/29/2018 21.6 -1.5 -1.5 1.001 g/cm2 DualFemur Total Left 10/29/2018 21.6 -1.1 -1.1 0.872 g/cm2 DualFemur Total Mean 10/29/2018 21.6 -1.0 -1.0 0.884 g/cm2 RECOMMENDATION: All patients should ensure an adequate intake of dietary calcium (1200 mg/d) and vitamin D (800 IU daily) unless contraindicated. FOLLOW-UP: As clinically indicated I have reviewed this report and agree with the above findings. Mark A. Tyron Russell, M.D. [facility] Electronically Signed   By: Ulyses Southward M.D.   On: 10/29/2018 08:31     Oralia Manis, DO 11/07/2018, 9:57 AM PGY-2, [facility] FPTS Intern pager: [phone], text pages welcome

## 2018-11-08 DIAGNOSIS — E440 Moderate protein-calorie malnutrition: Secondary | ICD-10-CM

## 2018-11-08 LAB — BASIC METABOLIC PANEL
ANION GAP: 6 (ref 5–15)
BUN: 13 mg/dL (ref 6–20)
CO2: 30 mmol/L (ref 22–32)
Calcium: 9 mg/dL (ref 8.9–10.3)
Chloride: 105 mmol/L (ref 98–111)
Creatinine, Ser: 0.76 mg/dL (ref 0.44–1.00)
GFR calc Af Amer: >60 mL/min (ref >60)
GFR calc non Af Amer: >60 mL/min (ref >60)
Glucose, Bld: 89 mg/dL (ref 70–99)
POTASSIUM: 4.3 mmol/L (ref 3.5–5.1)
Sodium: 141 mmol/L (ref 135–145)

## 2018-11-08 LAB — PHOSPHORUS: Phosphorus: 4.4 mg/dL (ref 2.5–4.6)

## 2018-11-08 LAB — MAGNESIUM: Magnesium: 2.1 mg/dL (ref 1.7–2.4)

## 2018-11-08 NOTE — Progress Notes (Signed)
Family Medicine Teaching Service Daily Progress Note Intern Pager: [phone]  Patient name: Janet Hahn Medical record number: [id number] Date of birth: 03/21/1996 Age: 23 y.o. Gender: female  Primary Care Provider: Kremer, William Alfred, MD Consultants: Nutrition  Code Status: Full  Pt Overview and Major Events to Date:  Admitted to FPTS on 11/05/18 for symptomatic bradycardia   Assessment and Plan: Ayva Schroepfer is a 23-year-old female who was admitted to the hospital for symptomatic bradycardia. Her past medical history is significant for bulimia and anorexia.  Weight loss and bradycardia secondary tobulimia Improving.  Goal of HR >40 overnight and >45 during day has been met over past 24 hours.  Weight has been stable and slightly increased this admission. 46.6kg today. Orthostatic HR showed adequate response today but BP was not recorded. Dietician has been following and patient has increased diet to vegetarian options from vegan as she is motivated to leave the hospital. Patient's meal had not arrived yet at time of interview. She is scheduled to get three meals and three snacks per day. Adolescent NP paged to inform that it's okay to discontinue the sitter and IV at this time and patient has a f/u apt scheduled with them for next Wednesday. -Strict diet protocol's, see printed algorithm - d/c sitter  Prolonged QTc previous EKG from 12/30 showing QTc of 474. improving with QTc 405 yesterday. Will no longer check unless suspicion of change -daily BMP, Mg, Phos -replete electrolytes as needed   Electrolyte imbalances- Goal K >4, Magnesium >2, phosphorous near ULN. AM labs showing K 4.3, Mg 2.1, Phos 4.4 s/p magnesium supplement yesterday -Monitor BMP, magnesium, phosphorus daily for refeeding syndrome  Osteopenia Likely 2/2 inadequate intake of Ca. Patient had a Z-score of -1.5 on her bone density scan done on 10/29/2018.  - calcium and vitamin D supplementation - see  eating plan for problem above  FEN/GI:Diet as per protocol PPx:Lovenox  Disposition: continued admission for bradycardia   Subjective:  Patient states she is doing well. She has been very tired so is sleeping well while inpatient. She is very anxious to get out of hospital which motivates her to follow diet plan. She and her mother are upset that her meals are still not coming on time but is understanding. She was previously a vegan but has increased her dietary restrictions to vegetarian to be able to increase her food intake.   Objective: Temp:  [97.5 F (36.4 C)-99.4 F (37.4 C)] 97.5 F (36.4 C) (01/03 0353) Pulse Rate:  [46-55] 46 (01/03 0353) Resp:  [18] 18 (01/03 0353) BP: (104-119)/(66-73) 104/66 (01/03 0353) SpO2:  [99 %-100 %] 100 % (01/03 0353) Weight:  [46.7 kg] 46.7 kg (01/02 2012)   Physical Exam: General: awake and alert, sitting up in bed with mother at bedside, NAD. Thin habitus  Cardiovascular: RRR, no MRG  Respiratory: CTAB, no wheezes, rales, or rhonchi  Abdomen: soft, non tender, non distended, bowel sounds normal  Extremities: non tender, no edema   Laboratory: Recent Labs  Lab 11/01/18 1600 11/05/18 1955  WBC 4.5 4.7  HGB 15.6 14.7  HCT 44.7 43.4  PLT 243 201   Recent Labs  Lab 11/01/18 1600 11/05/18 1955  11/07/18 0409 11/07/18 1431 11/08/18 0553  NA 142 141   < > 142 140 141  K 4.0 3.9   < > 4.7 3.8 4.3  CL 103 104   < > 108 106 105  CO2 26 30   < > 29 26 30    BUN 10 8   < > 16 14 13  CREATININE 0.71 0.68   < > 0.72 0.67 0.76  CALCIUM 9.0 9.0   < > 9.0 8.7* 9.0  PROT 6.0 5.6*  --   --   --   --   BILITOT 0.4 0.3  --   --   --   --   ALKPHOS 70 46  --   --   --   --   ALT 47* 48*  --   --   --   --   AST 25 29  --   --   --   --   GLUCOSE 78 98   < > 85 91 89   < > = values in this interval not displayed.     Ref. Range 11/08/2018 04:09  Phosphorus Latest Ref Range: 2.5 - 4.6 mg/dL 4.4  Magnesium Latest Ref Range: 1.7 - 2.4 mg/dL  2.1    Ref. Range 11/07/2018 04:09  Vitamin B12 Latest Ref Range: 180 - 914 pg/mL 812   Imaging/Diagnostic Tests: Dg Bone Density  Result Date: 10/29/2018 EXAM: DUAL X-RAY ABSORPTIOMETRY (DXA) FOR BONE MINERAL DENSITY IMPRESSION: Referring Physician: JOHN C LALONDE Your patient completed a BMD test using Lunar IDXA DXA system ( analysis version: 16 ) manufactured by [employer]. Technologist: WLS PATIENT: Name: Bents, Coriann R Patient ID: [id number] Birth Date: 08/06/1996 Height: 67.0 in. Sex: Female Measured: 10/29/2018 Weight: 102.8 lbs. Indications: Amenorrhea, Caucasian, Low Body Weight (783.22) Fractures: None Treatments: None ASSESSMENT: The BMD measured at Lumbar spine is 1.001 g/cm2 with a Z-score of -1.5 . The Z-score is within expected range for age. ISCD recommends using the Z-scores for assessment of pre-menopausal women, men 20 and 50 years of age and children under 20 years of age. The diagnosis of osteoporosis in these patients should not be made on the basis of densitometric criteria alone.) The scan quality is good. Site      Region     Measured Date Measured Age YA      AM      BMD T-score Z-score AP Spine L1-L4 10/29/2018 21.6 -1.5 -1.5 1.001 g/cm2 DualFemur Total Left 10/29/2018 21.6 -1.1 -1.1 0.872 g/cm2 DualFemur Total Mean 10/29/2018 21.6 -1.0 -1.0 0.884 g/cm2 RECOMMENDATION: All patients should ensure an adequate intake of dietary calcium (1200 mg/d) and vitamin D (800 IU daily) unless contraindicated. FOLLOW-UP: As clinically indicated I have reviewed this report and agree with the above findings. Mark A. Boles, M.D. [facility] Electronically Signed   By: Mark  Boles M.D.   On: 10/29/2018 08:31     Anderson, Chelsey L, DO 11/08/2018, 7:09 AM PGY-1, [facility] FPTS Intern pager: [phone], text pages welcome  

## 2018-11-08 NOTE — Progress Notes (Signed)
LATE ENTRY  This RD on-call on 11/06/2018. Dietitian Consult ordered 11/06/2018 at 0927.  Received multiple pages throughout the day regarding this patient. This RD spoke with bedside RN, Salome as well as pt's Mother via phone. In addition to this, RD spoke with Hassie Bruce and MD regarding patient's care.   RN paged as family upset that pt only received green beans, salad and roll on lunch tray. Pt is vegan but agreeable to eggs at breakfast tray. Limited vegan options in-house  (offered veggie burger, peanut butter sandwich, etc), so this RD notified family that they are allowed to bring in outside food for pt to promote po intake. Originally family was told they were not allowed to bring in outside food but this RD spoke with MD and pt will be allowed to bring in outside food.   Noted nursing leadership called [facility] and Pediatric unit for clarification on the Pediatric Eating Disorder Protocol and documents were sent to [facility] unit. It appears that the nursing staff were attempting to adhere to the pediatric eating disorder protocol. This RD notified AC, MD and RN that the Adult units are not trained to utilize the Pediatric Eating Disorder Protocol and that it is not appropriate to utilize this on our Adult Units. In fact, it appears that much of the frustration from the family has been related to this attempted adherence to this protocol.  Discussed goals of care with pt's family, AC and MD. Goals are to maintain electrolyte balance, heart rate control and to optimize oral intake.   RD called [facility] and had stat Lunch tray sent up for patient and placed vegan meal orders for dinner (11/06/18) and breakfast (11/07/2018). RD also spoke with [employer] in [place] regarding importance of timely delivery of meal trays.   RD notified MD, RN, [facility] and pt's family that an RD would be by on 11/07/2018 to complete full assessment on patient.   Romelle Starcher MS, RD, LDN, CNSC [phone] Pager  [phone]  Weekend/On-Call Pager

## 2018-11-08 NOTE — Consult Note (Signed)
Adolescent Medicine Consultation Janet MoanHolly R Wagoner  is a 23 y.o. female admitted for symptomatic bradycardia, moderate malnutrition, monitoring for refeeding syndrome.      PCP Confirmed?  yes  Mliss SaxKremer, William Alfred, MD   History was provided by the patient, father and mother.   Chart review/HPI:  There was again some confusion this morning about who was placing her orders in the kitchen so no breakfast tray arrived. When it did arrive, there was sausage, but she managed to eat around it. Her snack arrived while I was visiting with her. I called the kitchen and confirmed that there are orders placed for meals through Saturday PM. The RN confirmed there are snacks available on the floor as agreed on yesterday.   Patient reports she has felt well with no dizziness, chest pain, SOB. She has been compliant with meals. She is agreeable with parents to get [employer] tonight.   Lytes replaced yesterday x 1 with mag, K in the AM. Attempting to optimize given longer qtc, which is improving.    If she can't complete any of her meals:  0-25%- 2 boost breeze 25-50%- 1.5 boost breeze 50-75%- 1 boost breeze  75-100%- 0.5 boost breeze   If snack is incomplete or unavailable, 1 boost breeze.   Social History: Parents moved to [place] 6 months ago, but are at bedside with her currently. [employer] (will do a 5th year) majoring in nutritional studies with potential plan for nursing school. Of note, patient is a baby of IVF with a donor egg, so half of genetic history is unknown.   Physical Exam:  Vitals:   11/07/18 1407 11/07/18 2012 11/08/18 0353 11/08/18 0823  BP: 119/72 116/73 104/66   Pulse: (!) 55 (!) 50 (!) 46   Resp: 18 18 18    Temp: 97.7 F (36.5 C) 99.4 F (37.4 C) (!) 97.5 F (36.4 C)   TempSrc: Oral Oral Oral   SpO2: 100% 99% 100%   Weight:  46.7 kg  46.6 kg  Height:       BP 104/66 (BP Location: Left Arm)   Pulse (!) 46   Temp (!) 97.5 F (36.4 C) (Oral)   Resp 18   Ht 5' 6.5" (1.689  m)   Wt 46.6 kg Comment: scale A  LMP 09/06/2016   SpO2 100%   BMI 16.34 kg/m  Body mass index: body mass index is 16.34 kg/m. Growth percentile SmartLinks can only be used for patients less than 22 years old.  Physical Exam  Constitutional: She is oriented to person, place, and time. She appears well-developed and well-nourished.  HENT:  Head: Normocephalic.  Neck: No thyromegaly present.  Cardiovascular: Normal rate, regular rhythm, normal heart sounds and intact distal pulses.  Pulmonary/Chest: Effort normal and breath sounds normal.  Abdominal: Soft. Bowel sounds are normal. There is no abdominal tenderness.  Musculoskeletal: Normal range of motion.  Neurological: She is alert and oriented to person, place, and time.  Skin: Skin is warm and dry.  Carotenemia   Psychiatric: She has a normal mood and affect.     Assessment/Plan: 1. Weight loss/disordered eating  Goal per outpatient RD is to get to 2600 kcal daily. I would like to push to this fairly quickly given that she is under hospital monitoring for electrolyte and cardiac disturbances so we can ensure she will tolerate this volume of food. She reports being comfortable with this plan. Meal plans above provide 1800-2000 kcal in B, L and D and 3 snacks  should provide additional kcal to get to 2600/day. She is keeping track of what she is eating. Explained protocol with boost breeze if she can't finish/doesn't like something she gets, which she is amenable to. Volumes as above. She has outpt RD appt on Monday that we would ideally get her to if HR is improved to 45 daytime, 40 night, and elytes stable. We have hopefully clarified issues with trays.    Ok to discontinue sitter.  Ok to leave IV out if it is expired today.  I communicated both these things with primary RN and primary team intern Dr. Dareen Piano.   2. Bradycardia  Goal 45 daytime, 40 nighttime as an average. She is asymptomatic today and her orthostatic vitals on  admission were reassuring. QTc improving. If HR and lytes stable today and tomorrow, ok for late discharge Saturday PM.   3. Secondary amenorrhea  Bone density consistent with hypoestrogen state. We talked about the risks of stress fracture. Continue calcium 600 mg daily and vit d 2000 IU daily as well as MVI. Estradiol drawn today, LH and FSH suppressed as expected. Discussed this with patient and family.   Disposition Plan: Likely late d/c Saturday. RD f/u Monday at 11 am, clinic with me Wednesday. Please don't hesitate to call/text with questions- [phone].   Medical decision-making:  > 25 minutes spent, more than 50% of appointment was spent discussing diagnosis and management of symptoms

## 2018-11-08 NOTE — Progress Notes (Signed)
Family Medicine Teaching Service Daily Progress Note Intern Pager: [phone]  Patient name: Janet Hahn Medical record number: [id number] Date of birth: [birth year] Age: 23 y.o. Gender: female  Primary Care Provider: [name], MD Consultants: Nutrition  Code Status: Full  Pt Overview and Major Events to Date:  Admitted to [facility] on 11/05/18 for symptomatic bradycardia   Assessment and Plan: Janet Hahn is a 23 year old female who was admitted to the hospital for symptomatic bradycardia. Her past medical history is significant for bulimia and anorexia.  Weight loss and bradycardia secondary tobulimia: Improving Goal of HR >40 overnight and >45 during day has been met over past 24 hours. HR this AM 42 and repeat HR 60. Weight has been stable and increased since admission. 46.9kg today, up 30kg. Patient continuing Vegetarian diet and tolerating well. She is scheduled to get three meals and three snacks per day. She is taking 100% of meals. Follow-up with Dietician on 1/6 and Adolescent medicine 1/8. - Dietician and Adolescent medicine following - Strict diet protocol's, see printed algorithm  Electrolyte imbalances- Goal K >4, Magnesium >2, phosphorous near ULN. AM labs showing K 4.2, Mg 2, Phos 3.9  -Monitor BMP, magnesium, phosphorus daily for refeeding syndrome  Prolonged QTc: Improved previous EKG from 12/30 showing QTc of 474. improving with QTc 405 yesterday. Will no longer check unless suspicion of change. Electrolytes stable this AM.  Osteopenia Likely 2/2 inadequate intake of Ca. Patient had a Z-score of -1.5 on her bone density scan done on 10/29/2018.  - calcium and vitamin D supplementation, MVI - see eating plan for problem above  FEN/GI:Diet as per protocol TIW:[id number]  Disposition: continued admission for bradycardia   Subjective:  Patient states she is doing well. She has been tolerating her meals well without any need for supplementation. She  notes improvement in receiving her meals on time. She denies any chest pains, lightheadedness, dizziness, or other complaints. Patient is eager to go home.  Objective: Temp:  [97.6 F (36.4 C)-98.1 F (36.7 C)] 97.6 F (36.4 C) (01/04 0513) Pulse Rate:  [42-112] 42 (01/04 0513) Resp:  [16-18] 16 (01/04 0513) BP: (102-116)/(59-73) 102/59 (01/04 0513) SpO2:  [97 %-100 %] 100 % (01/04 0513) Weight:  [46.6 kg-46.9 kg] 46.9 kg (01/04 0515)   Physical Exam General: well nourished, well developed, in no acute distress with non-toxic appearance, lying comfortably in bed HEENT: normocephalic, atraumatic, moist mucous membranes CV: regular rate and rhythm without murmurs, rubs, or gallops, no lower extremity edema, cap refill <2 sec, 2+ radial and pedal pulses in the 60's  Lungs: clear to auscultation bilaterally with normal work of breathing Abdomen: soft, non-tender, non-distended, normoactive bowel sounds Skin: warm, dry, no rashes or lesions Extremities: warm and well perfused, normal tone Neuro: Alert and oriented, speech normal  Laboratory: Recent Labs  Lab 11/05/18 1955  WBC 4.7  HGB 14.7  HCT 43.4  PLT 201   Recent Labs  Lab 11/05/18 1955  11/07/18 1431 11/08/18 0553 11/09/18 0505  NA 141   < > 140 141 140  K 3.9   < > 3.8 4.3 4.2  CL 104   < > 106 105 105  CO2 30   < > 26 30 30   BUN 8   < > 14 13 11   CREATININE 0.68   < > 0.67 0.76 0.59  CALCIUM 9.0   < > 8.7* 9.0 9.0  PROT 5.6*  --   --   --   --  BILITOT 0.3  --   --   --   --   ALKPHOS 46  --   --   --   --   ALT 48*  --   --   --   --   AST 29  --   --   --   --   GLUCOSE 98   < > 91 89 89   < > = values in this interval not displayed.     Ref. Range 11/08/2018 11/09/2018 04:09  Phosphorus Latest Ref Range: 2.5 - 4.6 mg/dL 4.4 4.2  Magnesium Latest Ref Range: 1.7 - 2.4 mg/dL 2.1 2.0    Ref. Range 11/07/2018 04:09  Vitamin B12 Latest Ref Range: 180 - 914 pg/mL 812   Imaging/Diagnostic Tests: Dg Bone  Density  Result Date: 10/29/2018 EXAM: DUAL X-RAY ABSORPTIOMETRY (DXA) FOR BONE MINERAL DENSITY IMPRESSION: Referring Physician: Denita Hahn Your patient completed a BMD test using Lunar IDXA DXA system ( analysis version: 16 ) manufactured by [employer]. Technologist: WLS PATIENT: Name: Janet, Hahn Patient ID: [id number] Birth Date: September 20, 1996 Height: 67.0 in. Sex: Female Measured: 10/29/2018 Weight: 102.8 lbs. Indications: Amenorrhea, Caucasian, Low Body Weight (783.22) Fractures: None Treatments: None ASSESSMENT: The BMD measured at Lumbar spine is 1.001 g/cm2 with a Z-score of -1.5 . The Z-score is within expected range for age. ISCD recommends using the Z-scores for assessment of pre-menopausal women, men 43 and 37 years of age and children under 48 years of age. The diagnosis of osteoporosis in these patients should not be made on the basis of densitometric criteria alone.) The scan quality is good. Site      Region     Measured Date Measured Age YA      AM      BMD T-score Z-score AP Spine L1-L4 10/29/2018 21.6 -1.5 -1.5 1.001 g/cm2 DualFemur Total Left 10/29/2018 21.6 -1.1 -1.1 0.872 g/cm2 DualFemur Total Mean 10/29/2018 21.6 -1.0 -1.0 0.884 g/cm2 RECOMMENDATION: All patients should ensure an adequate intake of dietary calcium (1200 mg/d) and vitamin D (800 IU daily) unless contraindicated. FOLLOW-UP: As clinically indicated I have reviewed this report and agree with the above findings. Janet Hahn, M.D. [facility] Radiology Electronically Signed   By: Janet Hahn M.D.   On: 10/29/2018 08:31     Danna Hefty, DO 11/09/2018, 7:57 AM PGY-1, [place] Intern pager: [phone], text pages welcome

## 2018-11-08 NOTE — Plan of Care (Signed)
  Problem: Clinical Measurements: Goal: Will remain free from infection Outcome: Progressing Goal: Diagnostic test results will improve Outcome: Progressing Goal: Cardiovascular complication will be avoided Outcome: Progressing   Problem: Nutrition: Goal: Adequate nutrition will be maintained Outcome: Progressing   Problem: Pain Managment: Goal: General experience of comfort will improve Outcome: Progressing   

## 2018-11-08 NOTE — Progress Notes (Addendum)
Brief Nutrition Follow-Up Note  Received page from RN. Pt did not receive her breakfast tray this morning and is very upset.   Please refer to RD note yesterday morning. RD spend large amount of time with pt yesterday morning, which mainly included obtaining supplements, food preferences, and agreeing on a predictable meal plan for pt to help decrease anxiety with meal times and to allow pt to better supplement outside foods and snacks. Plan that was agreed upon with pt, family and RD yesterday is below:  Breakfast (0800): black coffee, scrambled eggs, oatmeal, banana, english muffin with peanut butter, soy milk (695 kcals, 30 grams protein) Snack (1000): Raisin bran and soy milk (240 kcals, 9 grams protein) Lunch (1200): soy milk, peanut butter sandwich on 2 slices of wheat bread (553 kcals, 19 grams protein) Snack (1400): Apple and peanut butter (250 kcals, 5 grams protein) Dinner (1700): veggie burger, mustard, soy milk, carrots, and rice (459 kcals, 18 grams protein) Snack (2000): Cheerios and soy milk (200 kcals, 8 grams protein)  Meals, snacks, and supplements will provide a total of 3147 kcals and 116 grams protein (meeting >100% of estimated kcal needs and 100% of estimated protein needs) provided pt consumes 100% of meals, snacks, and supplements.   Per discussion with RN, Adolescent Medicine NP created a meal plan for pt when meeting with pt and family yesterday, which pt received in writing. RD did speak with Adolescent Medicine NP yesterday, however, RD was not under the impression that plan of care was to follow diet plan devised by NP.   RN has communicated with Adolescent Medicine NP, who will speak with pt later today to clarify plan of care. RD will be available for follow-up once pt plan of care and RD role is established by Adolescent Medicine NP. RN in agreement with this plan.   Due to multiple instances of confusion regarding care plan with pt, family, and care team,  recommend interdisciplinary meeting to firmly establish goals of care for this pt.   Ariv Penrod A. Mayford Knife, RD, LDN, CDE Pager: [phone] After hours Pager: [phone]

## 2018-11-08 NOTE — Progress Notes (Addendum)
Brief Nutrition Follow-Up Note  Spoke with Alfonso Ramus, Adolescent Medicine NP, over phone to clarify plan of care for pt. She reports that pt will likely get discharged tomorrow (11/09/18), pending stabilization of heart rate and electrolytes. Plan is follow up with outpatient RD Danise Edge of [facility]) on 11/11/18 and at clinic with Rayfield Citizen, NP at Adolescent Medicine on Wednesday, 11/13/18.   Per Rayfield Citizen, plan is to follow meal plan as designated by her note. Pt parents have agreed to purchase desired [employer] meal for pt for dinner. She reports she has already contacted nutritional services department to correct changes for future trays.   Per NP Hacker's note- pt's ordered meals are as follows:   Friday (11/08/17):  B: oatmeal, 2 scrambled eggs, lactaid or soymilk, fresh fruit L: veggie burger on bun, lactaid, rice, 2 servings of broccoli  D: [employer] mediterranean grain bowl OR baja grain bowl (no cheese), lactaid/soymilk, cookie  Saturday (11/09/18):  B: pancakes or french toast with 2 eggs and lactaid/soymilk, fresh fruit  L: PBJ sandwich, 2 servings green beans, lactaid/soymilk  D: [employer] avocado, egg and spinach on multigrain or everything bagel OR modern greek salad with quinoa (no cheese), cookie  Sunday (11/10/18):  B: banana nut muffin, lactaid/soymilk, 2 eggs  L: Pick a [employer] option above  D: Pick a [employer] option above   RD also personally reviewed meal orders in health touch for accuracy.   Also discussed with NP her expectations/ role of RD going forward. NP reports that RD is to ensure that pt has adequate snacks; this RD informed NP that snacks orders were ordered on a recurring basis yesterday (11/07/18) per pt preferences and pt was informed of available floorstock items. RN has been offering pt floorstock items as pt requests them.   Called and spoke with bedside RN (Salome) at 1134 and informed her regarding above conversation.   Kashay Cavenaugh A. Mayford Knife, RD, LDN,  CDE Pager: [phone] After hours Pager: [phone]

## 2018-11-09 LAB — BASIC METABOLIC PANEL
ANION GAP: 5 (ref 5–15)
BUN: 11 mg/dL (ref 6–20)
CO2: 30 mmol/L (ref 22–32)
Calcium: 9 mg/dL (ref 8.9–10.3)
Chloride: 105 mmol/L (ref 98–111)
Creatinine, Ser: 0.59 mg/dL (ref 0.44–1.00)
GFR calc Af Amer: >60 mL/min (ref >60)
GFR calc non Af Amer: >60 mL/min (ref >60)
Glucose, Bld: 89 mg/dL (ref 70–99)
Potassium: 4.2 mmol/L (ref 3.5–5.1)
SODIUM: 140 mmol/L (ref 135–145)

## 2018-11-09 LAB — PHOSPHORUS: Phosphorus: 3.9 mg/dL (ref 2.5–4.6)

## 2018-11-09 LAB — ESTRADIOL: ESTRADIOL: 14.2 pg/mL

## 2018-11-09 LAB — MAGNESIUM: Magnesium: 2 mg/dL (ref 1.7–2.4)

## 2018-11-09 MED ORDER — ADULT MULTIVITAMIN W/MINERALS CH: 1.0000 | ORAL_TABLET | Freq: Every day | ORAL | Status: AC

## 2018-11-09 NOTE — Discharge Instructions (Signed)
You were admitted to [facility] for low heart rate and weight loss. We monitored your heart rate and electrolytes as we worked with the dietician to create an eating plan. You heart rate and blood work continued to improve daily. We are so glad you are feeling better. It will be important for you to follow-up with the registered dietician and adolescent medicine at your scheduled appointments this coming week. Please continue to follow the eating plan created for you.   Please see your PCP or return to care sooner if you begin to experience chest pain/pressure, difficulty breathing, decreased urinary frequency, blood or brown fleck (like coffee grounds) in your vomit, bright red or black stool, and have any serious thoughts of hurting yourself.  Thank you for allowing Janet Hahn to take care of you!

## 2018-11-11 DIAGNOSIS — Z713 Dietary counseling and surveillance: Secondary | ICD-10-CM | POA: Diagnosis not present

## 2018-11-13 ENCOUNTER — Encounter: Payer: Self-pay | Admitting: Pediatrics

## 2018-11-13 ENCOUNTER — Ambulatory Visit (INDEPENDENT_AMBULATORY_CARE_PROVIDER_SITE_OTHER): Payer: BLUE CROSS/BLUE SHIELD | Admitting: Pediatrics

## 2018-11-13 VITALS — BP 98/66 | HR 81 | Ht 66.0 in | Wt 104.6 lb

## 2018-11-13 DIAGNOSIS — R001 Bradycardia, unspecified: Secondary | ICD-10-CM | POA: Insufficient documentation

## 2018-11-13 DIAGNOSIS — R634 Abnormal weight loss: Secondary | ICD-10-CM | POA: Diagnosis not present

## 2018-11-13 DIAGNOSIS — N911 Secondary amenorrhea: Secondary | ICD-10-CM | POA: Diagnosis not present

## 2018-11-13 DIAGNOSIS — E440 Moderate protein-calorie malnutrition: Secondary | ICD-10-CM

## 2018-11-13 DIAGNOSIS — M8588 Other specified disorders of bone density and structure, other site: Secondary | ICD-10-CM

## 2018-11-13 DIAGNOSIS — Z1389 Encounter for screening for other disorder: Secondary | ICD-10-CM | POA: Diagnosis not present

## 2018-11-13 DIAGNOSIS — F509 Eating disorder, unspecified: Secondary | ICD-10-CM

## 2018-11-13 LAB — POCT URINALYSIS DIPSTICK
Bilirubin, UA: NEGATIVE
Blood, UA: NEGATIVE
Glucose, UA: NEGATIVE
Ketones, UA: NEGATIVE
Leukocytes, UA: NEGATIVE
Nitrite, UA: NEGATIVE
Protein, UA: NEGATIVE
Spec Grav, UA: 1.01 (ref 1.010–1.025)
UROBILINOGEN UA: 1 U/dL
pH, UA: 7 (ref 5.0–8.0)

## 2018-11-13 NOTE — Progress Notes (Signed)
History was provided by the patient.  Janet Hahn is a 23 y.o. female who is here for hospital f/u.  [name], MD   HPI:  Pt was hospitalized for 4 days over the weekend for monitoring of refeeding and symptomatic bradycardia. She did well with nutritional intake and her heart rate was noted to improve appropriately. She did have K and Mg repleted once during her stay. She has since been at home and met with dietitian on Monday. She will continue to see her once weekly.   Had dexa prior to admission that showed z score of -1.5 in spine and -1.0 in hips bilaterally. She is now taking calcium and vit D.   [name] was able to clarify a lot in terms of portions at their appt.   Still fatigued but not worse than normal. Sleeping ok at night.   + gas and bloating. Denies constipation. Generally always full but responding to this well. Denies any feelings of poor body image at this time. Definitely has had guilt about eating large amounts of processed things in the past, but overall feels good about her current eating plan and knowing she needs to gain weight.   Does have boyfriend but not currently sexually active. Has been in the past. We discussed contraception in regards to secondary amenorrhea today.   Patient's last menstrual period was 09/06/2016.  Review of Systems  Constitutional: Negative for malaise/fatigue.  Eyes: Negative for double vision.  Respiratory: Negative for shortness of breath.   Cardiovascular: Negative for chest pain and palpitations.  Gastrointestinal: Negative for abdominal pain, constipation, diarrhea, nausea and vomiting.  Genitourinary: Negative for dysuria.  Musculoskeletal: Negative for joint pain and myalgias.  Skin: Negative for rash.  Neurological: Negative for dizziness and headaches.  Endo/Heme/Allergies: Does not bruise/bleed easily.  Psychiatric/Behavioral: Negative for depression. The patient is not nervous/anxious and does not have  insomnia.     Patient Active Problem List   Diagnosis Date Noted  . Malnutrition of moderate degree 11/07/2018  . Prolonged Q-T interval on ECG   . Symptomatic bradycardia 11/05/2018  . 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 Heart murmur 09/24/2018  . Weight loss 09/24/2018  . Fatigue 04/05/2018  . Consumes a vegan diet 04/05/2018  . Secondary physiologic amenorrhea 04/05/2018    Current Outpatient Medications on File Prior to Visit  Medication Sig Dispense Refill  . calcium citrate-vitamin D (CITRACAL+D) 315-200 MG-UNIT tablet Take 2 tablets by mouth daily.     . Multiple Vitamin (MULTIVITAMIN WITH MINERALS) TABS tablet Take 1 tablet by mouth daily.     No current facility-administered medications on file prior to visit.     Allergies  Allergen Reactions  . Lactose Intolerance (Gi) Anaphylaxis    Gas Stomach cramping     Physical Exam:    Vitals:   11/13/18 1518  BP: 98/65  Pulse: (!) 47  Weight: 104 lb 9.6 oz (47.4 kg)  Height: 5' 6"  (1.676 m)    Growth percentile SmartLinks can only be used for patients less than 4 years old.  Physical Exam Vitals signs and nursing note reviewed.  Constitutional:      General: She is not in acute distress.    Appearance: She is well-developed.  HENT:     Head: Normocephalic and atraumatic.     Nose: Nose normal.     Mouth/Throat:     Mouth: Mucous membranes are moist.  Eyes:     Pupils: Pupils are equal, round, and  reactive to light.  Neck:     Thyroid: No thyromegaly.  Cardiovascular:     Rate and Rhythm: Regular rhythm. Bradycardia present.     Heart sounds: No murmur.  Pulmonary:     Breath sounds: Normal breath sounds.  Abdominal:     Palpations: Abdomen is soft. There is no mass.     Tenderness: There is no abdominal tenderness. There is no guarding.  Lymphadenopathy:     Cervical: No cervical adenopathy.  Skin:    General: Skin is warm.     Capillary Refill: Capillary refill takes 2 to 3 seconds.      Findings: No rash.  Neurological:     Mental Status: She is alert.  Psychiatric:        Mood and Affect: Mood normal.     Assessment/Plan: 1. Malnutrition of moderate degree Is eating eggs in addition to her overall vegan diet that she has been following for years. She likely lost weight when she cut out most processed things, but was willing to add in things like baked goods in the hospital. Having some hair loss which we discussed is normal and expected, may get worse before better.   2. Secondary physiologic amenorrhea Amenorrheic since 09/2016. Bone density done and low for age. Discussed contraception options if she becomes sexually active again- she would ideally want IUD which I think is a fine choice.   3. Weight loss Has lost about 30 pounds this semester- working on regain now.   4. Bradycardia Improved from hospital, some orthostasis but no dizziness.   5. Eating disorder, unspecified type Unclear what type of eating disorder pathology will evolve here as she works on weight gain. Time will tell.   6. Osteopenia of lumbar spine -1.5 z score. Ca and vit d supplementation in place.   7. Screening for genitourinary condition WNL. - POCT urinalysis dipstick

## 2018-11-13 NOTE — Patient Instructions (Signed)
Look at [employer] at grocery store

## 2018-11-20 DIAGNOSIS — Z713 Dietary counseling and surveillance: Secondary | ICD-10-CM | POA: Diagnosis not present

## 2018-11-21 ENCOUNTER — Encounter: Payer: Self-pay | Admitting: Pediatrics

## 2018-11-21 ENCOUNTER — Ambulatory Visit (INDEPENDENT_AMBULATORY_CARE_PROVIDER_SITE_OTHER): Payer: BLUE CROSS/BLUE SHIELD | Admitting: Pediatrics

## 2018-11-21 VITALS — BP 100/67 | HR 69 | Ht 66.0 in | Wt 104.4 lb

## 2018-11-21 DIAGNOSIS — R001 Bradycardia, unspecified: Secondary | ICD-10-CM

## 2018-11-21 DIAGNOSIS — Z1389 Encounter for screening for other disorder: Secondary | ICD-10-CM | POA: Diagnosis not present

## 2018-11-21 DIAGNOSIS — E440 Moderate protein-calorie malnutrition: Secondary | ICD-10-CM

## 2018-11-21 DIAGNOSIS — F509 Eating disorder, unspecified: Secondary | ICD-10-CM | POA: Diagnosis not present

## 2018-11-21 DIAGNOSIS — N911 Secondary amenorrhea: Secondary | ICD-10-CM | POA: Diagnosis not present

## 2018-11-21 DIAGNOSIS — R112 Nausea with vomiting, unspecified: Secondary | ICD-10-CM

## 2018-11-21 LAB — POCT URINALYSIS DIPSTICK
Bilirubin, UA: NEGATIVE
Blood, UA: NEGATIVE
Glucose, UA: NEGATIVE
Ketones, UA: NEGATIVE
Leukocytes, UA: NEGATIVE
Nitrite, UA: NEGATIVE
Protein, UA: NEGATIVE
Spec Grav, UA: 1.01 (ref 1.010–1.025)
Urobilinogen, UA: 1 U/dL
pH, UA: 6 (ref 5.0–8.0)

## 2018-11-21 MED ORDER — ONDANSETRON 4 MG PO TBDP: 4.0000 mg | ORAL_TABLET | Freq: Three times a day (TID) | ORAL | 0 refills | Status: AC | PRN

## 2018-11-21 NOTE — Progress Notes (Signed)
History was provided by the patient.  Janet Hahn is a 23 y.o. female who is here for anorexia, moderate malnutrition, bradycardia, secondary amenorrhea.  Mliss Sax, MD   HPI:  Pt reports that she had a stomach virus. Today is the first day that she has felt almost 100%- had vomiting and diarrhea on Monday and Tuesday. Nausea yesterday. Headache during this time too.   Prior to this the meal plan was going fine. She had a very hungry phase for a few days and was eating a lot of different things. She has been enjoying sweets and things because she recognizes that if she has to gain weight, she might as well like what she is eating.    Seeing Candise Bowens and Vernona Rieger weekly.   No LMP recorded.  Review of Systems  Constitutional: Negative for malaise/fatigue.  Eyes: Negative for double vision.  Respiratory: Negative for shortness of breath.   Cardiovascular: Negative for chest pain and palpitations.  Gastrointestinal: Positive for abdominal pain, diarrhea, nausea and vomiting. Negative for constipation.  Genitourinary: Negative for dysuria.  Musculoskeletal: Negative for joint pain and myalgias.  Skin: Negative for rash.  Neurological: Positive for headaches. Negative for dizziness.  Endo/Heme/Allergies: Does not bruise/bleed easily.  Psychiatric/Behavioral: Negative for depression. The patient is not nervous/anxious and does not have insomnia.     Patient Active Problem List   Diagnosis Date Noted  . Bradycardia 11/13/2018  . Malnutrition of moderate degree 11/07/2018  . Prolonged Q-T interval on ECG   . Heart murmur 09/24/2018  . Weight loss 09/24/2018  . Fatigue 04/05/2018  . Consumes a vegan diet 04/05/2018  . Secondary physiologic amenorrhea 04/05/2018    Current Outpatient Medications on File Prior to Visit  Medication Sig Dispense Refill  . calcium citrate-vitamin D (CITRACAL+D) 315-200 MG-UNIT tablet Take 2 tablets by mouth daily.     . Multiple Vitamin  (MULTIVITAMIN WITH MINERALS) TABS tablet Take 1 tablet by mouth daily.     No current facility-administered medications on file prior to visit.     Allergies  Allergen Reactions  . Lactose Intolerance (Gi) Anaphylaxis    Gas Stomach cramping    Physical Exam:    Vitals:   11/21/18 1333  BP: 100/67  Pulse: 69  Weight: 104 lb 6.4 oz (47.4 kg)  Height: 5\' 6"  (1.676 m)    Growth percentile SmartLinks can only be used for patients less than 22 years old.  Physical Exam Vitals signs and nursing note reviewed.  Constitutional:      General: She is not in acute distress.    Appearance: She is well-developed.  Neck:     Thyroid: No thyromegaly.  Cardiovascular:     Rate and Rhythm: Normal rate and regular rhythm.     Heart sounds: No murmur.  Pulmonary:     Breath sounds: Normal breath sounds.  Abdominal:     Palpations: Abdomen is soft. There is no mass.     Tenderness: There is no abdominal tenderness. There is no guarding.  Musculoskeletal:     Right lower leg: No edema.     Left lower leg: No edema.  Lymphadenopathy:     Cervical: No cervical adenopathy.  Skin:    General: Skin is warm.     Capillary Refill: Capillary refill takes less than 2 seconds.     Findings: No rash.  Neurological:     Mental Status: She is alert.  Psychiatric:        Mood and  Affect: Mood normal.     Assessment/Plan: 1. Malnutrition of moderate degree Weight is stable from last week, but in the setting of her having had acute GI illness, it is good that she did not lose any weight overall.   2. Eating disorder, unspecified type Continues with therapy and dietitian, feels like she is following plan well.   3. Bradycardia Improved today, hands warmer.   4. Secondary physiologic amenorrhea Persistent, will watch with weight gain.   5. Screening for genitourinary condition WNL.  - POCT urinalysis dipstick  6. Non-intractable vomiting with nausea, unspecified vomiting  type Discussed if she is ill again using zofran to limit vomiting- she was in agreement.  - ondansetron (ZOFRAN ODT) 4 MG disintegrating tablet; Take 1 tablet (4 mg total) by mouth every 8 (eight) hours as needed for nausea or vomiting.  Dispense: 20 tablet; Refill: 0

## 2018-11-27 DIAGNOSIS — Z713 Dietary counseling and surveillance: Secondary | ICD-10-CM | POA: Diagnosis not present

## 2018-11-28 ENCOUNTER — Other Ambulatory Visit: Payer: Self-pay

## 2018-11-28 ENCOUNTER — Encounter: Payer: Self-pay | Admitting: Pediatrics

## 2018-11-28 ENCOUNTER — Ambulatory Visit (INDEPENDENT_AMBULATORY_CARE_PROVIDER_SITE_OTHER): Payer: BLUE CROSS/BLUE SHIELD | Admitting: Pediatrics

## 2018-11-28 VITALS — BP 119/73 | HR 80 | Ht 66.0 in | Wt 106.0 lb

## 2018-11-28 DIAGNOSIS — N911 Secondary amenorrhea: Secondary | ICD-10-CM | POA: Diagnosis not present

## 2018-11-28 DIAGNOSIS — R634 Abnormal weight loss: Secondary | ICD-10-CM

## 2018-11-28 DIAGNOSIS — Z1389 Encounter for screening for other disorder: Secondary | ICD-10-CM | POA: Diagnosis not present

## 2018-11-28 DIAGNOSIS — F5000 Anorexia nervosa, unspecified: Secondary | ICD-10-CM

## 2018-11-28 DIAGNOSIS — E440 Moderate protein-calorie malnutrition: Secondary | ICD-10-CM

## 2018-11-28 DIAGNOSIS — R42 Dizziness and giddiness: Secondary | ICD-10-CM

## 2018-11-28 DIAGNOSIS — K5900 Constipation, unspecified: Secondary | ICD-10-CM

## 2018-11-28 DIAGNOSIS — R001 Bradycardia, unspecified: Secondary | ICD-10-CM

## 2018-11-28 LAB — POCT URINALYSIS DIPSTICK
Bilirubin, UA: NEGATIVE
Blood, UA: NEGATIVE
Glucose, UA: NEGATIVE
KETONES UA: NEGATIVE
Leukocytes, UA: NEGATIVE
Nitrite, UA: NEGATIVE
Protein, UA: POSITIVE — AB
Spec Grav, UA: 1.01 (ref 1.010–1.025)
Urobilinogen, UA: NEGATIVE E.U./dL — AB
pH, UA: 5 (ref 5.0–8.0)

## 2018-11-28 NOTE — Patient Instructions (Signed)
Continue the good work! Continue with your treatment team weekly and we will see you in 2 weeks!

## 2018-11-28 NOTE — Progress Notes (Signed)
History was provided by the patient.  Janet Hahn is a 23 y.o. female who is here for follow-up for eating disorder. She has anorexia, moderate mal nutrition, weight loss, bradycardia, and secondary amenorrhea.   Mliss SaxKremer, William Alfred, MD   HPI:  Pt reports that she is overall doing okay. No gastro symptoms--her symptoms completely resolved on the day that she was seen last week. Reports compliance with her prescribed diet and therapy session; she is seeing Jen at 3:30 today. In terms of symptoms, she is still having some light headedness when standing or sitting up quickly. This is about the same, though, since she started eating more. She has had no fainting No chest pain or tightness. No abdominal pain. No vomiting or diarrhea. Is no longer having constipation -- will take one scoop of miralax in 8 ounces of water a couple of times a week as need to regulate her BM's. She does endorse some gas and bloating, but is pooping once daily.   Meal recall: 3 meals, 3 snacks, will probably increase   B: 1 cu[p dry oats, 1/2 zucchini, 1/2 banana, scoop protein powder AM Snack: protein bar L: bagel, vegan cream cheese 3 eggs PM snack: hasn't had yet Dinner yesterday: veggie burger,m broccoli, and soy milk Reports that she is drinking plenty of water. Tries to avoid salt as it makes her bloated.  Taking MVI and Citracal once daily  No spotting or periods yet.  No exercise, has random muscle aches. Has not tried OTC analgesics or stretching, really.   No LMP recorded.  ROS  Patient Active Problem List   Diagnosis Date Noted  . Bradycardia 11/13/2018  . Malnutrition of moderate degree 11/07/2018  . Prolonged Q-T interval on ECG   . Heart murmur 09/24/2018  . Weight loss 09/24/2018  . Fatigue 04/05/2018  . Consumes a vegan diet 04/05/2018  . Secondary physiologic amenorrhea 04/05/2018    Current Outpatient Medications on File Prior to Visit  Medication Sig Dispense Refill  .  calcium citrate-vitamin D (CITRACAL+D) 315-200 MG-UNIT tablet Take 2 tablets by mouth daily.     . Multiple Vitamin (MULTIVITAMIN WITH MINERALS) TABS tablet Take 1 tablet by mouth daily.    . ondansetron (ZOFRAN ODT) 4 MG disintegrating tablet Take 1 tablet (4 mg total) by mouth every 8 (eight) hours as needed for nausea or vomiting. 20 tablet 0   No current facility-administered medications on file prior to visit.     Allergies  Allergen Reactions  . Lactose Intolerance (Gi) Anaphylaxis    Gas Stomach cramping   Social History: - back in college  Physical Exam:    Vitals:   11/28/18 1337  BP: 119/73  Pulse: 80  Weight: 106 lb (48.1 kg)  Height: 5\' 6"  (1.676 m)   Vitals with BMI 11/28/2018 11/21/2018 11/13/2018 11/13/2018 11/09/2018  Height 5\' 6"  5\' 6"   5\' 6"    Weight 106 lbs 104 lbs 6 oz  104 lbs 10 oz    Vitals with BMI 11/09/2018  Height   Weight 103 lbs 6 oz    Growth percentile SmartLinks can only be used for patients less than 22 years old.  Physical Exam Vitals signs and nursing note reviewed.  Constitutional:      Appearance: Normal appearance. She is not ill-appearing or toxic-appearing.  HENT:     Head: Normocephalic.     Comments: No notable staining of the teeth    Nose: Nose normal. No congestion or rhinorrhea.  Mouth/Throat:     Mouth: Mucous membranes are dry.     Pharynx: No oropharyngeal exudate or posterior oropharyngeal erythema.  Eyes:     Conjunctiva/sclera: Conjunctivae normal.  Neck:     Musculoskeletal: Normal range of motion and neck supple.     Comments: Parotids not enlarged Cardiovascular:     Rate and Rhythm: Normal rate and regular rhythm.     Pulses: Normal pulses.     Heart sounds: No murmur.  Pulmonary:     Effort: Pulmonary effort is normal. No respiratory distress.     Breath sounds: No wheezing, rhonchi or rales.  Abdominal:     General: Abdomen is flat. There is no distension.     Palpations: There is no mass.     Tenderness:  There is no abdominal tenderness.  Musculoskeletal:        General: No tenderness or deformity.     Comments: 4+/5 strength in grip, shoulder abduction, elbow flexion/extension, hip flexion and extension, and ankle dorsiflexion bilaterally.  Lymphadenopathy:     Cervical: No cervical adenopathy.  Skin:    General: Skin is warm.     Capillary Refill: Capillary refill takes less than 2 seconds.     Coloration: Skin is not pale.     Findings: No erythema or rash.  Neurological:     Mental Status: She is alert.  Psychiatric:        Mood and Affect: Mood normal.        Behavior: Behavior normal.     Results for orders placed or performed in visit on 11/28/18 (from the past 24 hour(s))  POCT Urinalysis Dipstick     Status: Abnormal   Collection Time: 11/28/18  1:42 PM  Result Value Ref Range   Color, UA yellow    Clarity, UA clear    Glucose, UA Negative Negative   Bilirubin, UA neg    Ketones, UA neg    Spec Grav, UA 1.010 1.010 - 1.025   Blood, UA neg    pH, UA 5.0 5.0 - 8.0   Protein, UA Positive (A) Negative   Urobilinogen, UA negative (A) 0.2 or 1.0 E.U./dL   Nitrite, UA neg    Leukocytes, UA Negative Negative   Appearance     Odor       Assessment/Plan: Janet Hahn is a 23 y.o. female with a history of anorexia nervosa, moderate malnutrition, weight loss, secondary amenorrhea, and malnutrition-related bradycardia who presents for routine eating disorder followup;Marland Kitchen.She has gained ~2 lbs over the past week and is not bradycardic on exam today. Still with some symptoms of orthostatic dizziness. Also with generalized body aches that come and go and are not localized to a specific location. All of these are likely due to her underlying malnutrition, which should correct with time. Given how well she is doing, we will space visits to once every 2 weeks. She is to continue seeing her dietitian and therapist. The remainder of her plan is as follows:    1. Malnutrition of  moderate degree 2. Weight loss - improved today. No longer with viral gastro symptoms - space visits to once every two weeks - continue with outpatient therapy plans - continue vegan diet with addition of eggs  3. Secondary physiologic amenorrhea - Persistent, will watch weight gain  4. Bradycardia - improved on today's visit; hands warm - continue to monitor  5. Screening for genitourinary condition - POCT Urinalysis Dipstick  6. Generalized muscle ache - Likely  related to increased protein intake and storage - continue diet and supplementation plan - counseled on daily stretching - use OTC analgesics sparingly as needed  7. Orthostatic dizziness - counseled on water intake - counseled on not eliminating salt entirely from diet - will continue to follow  8. Constipation, unspecified constipation type - Can consider giving 1/2 scoop of miralax in 8 ounces if stools are too loose  Irene Shipper, MD 11/28/18

## 2018-11-28 NOTE — Progress Notes (Signed)
I have reviewed the resident's note and plan of care and helped develop the plan as necessary.  

## 2018-12-04 DIAGNOSIS — Z713 Dietary counseling and surveillance: Secondary | ICD-10-CM | POA: Diagnosis not present

## 2018-12-11 DIAGNOSIS — Z713 Dietary counseling and surveillance: Secondary | ICD-10-CM | POA: Diagnosis not present

## 2018-12-12 ENCOUNTER — Ambulatory Visit: Payer: BLUE CROSS/BLUE SHIELD | Admitting: Pediatrics

## 2018-12-18 DIAGNOSIS — Z713 Dietary counseling and surveillance: Secondary | ICD-10-CM | POA: Diagnosis not present

## 2018-12-19 ENCOUNTER — Ambulatory Visit (INDEPENDENT_AMBULATORY_CARE_PROVIDER_SITE_OTHER): Payer: BLUE CROSS/BLUE SHIELD

## 2018-12-19 VITALS — BP 109/71 | HR 64 | Ht 65.75 in | Wt 117.4 lb

## 2018-12-19 DIAGNOSIS — R635 Abnormal weight gain: Secondary | ICD-10-CM

## 2018-12-19 DIAGNOSIS — E440 Moderate protein-calorie malnutrition: Secondary | ICD-10-CM

## 2018-12-19 NOTE — Progress Notes (Signed)
Pt here today for vitals check. Collaborated with NP- plan of care made. Follow up scheduled for 2/18.

## 2018-12-25 DIAGNOSIS — Z713 Dietary counseling and surveillance: Secondary | ICD-10-CM | POA: Diagnosis not present

## 2018-12-26 ENCOUNTER — Encounter: Payer: Self-pay | Admitting: Pediatrics

## 2018-12-26 ENCOUNTER — Ambulatory Visit (INDEPENDENT_AMBULATORY_CARE_PROVIDER_SITE_OTHER): Payer: BLUE CROSS/BLUE SHIELD | Admitting: Pediatrics

## 2018-12-26 VITALS — BP 119/75 | HR 81 | Ht 66.22 in | Wt 120.4 lb

## 2018-12-26 DIAGNOSIS — R42 Dizziness and giddiness: Secondary | ICD-10-CM | POA: Diagnosis not present

## 2018-12-26 DIAGNOSIS — R001 Bradycardia, unspecified: Secondary | ICD-10-CM

## 2018-12-26 DIAGNOSIS — N911 Secondary amenorrhea: Secondary | ICD-10-CM | POA: Diagnosis not present

## 2018-12-26 DIAGNOSIS — Z1389 Encounter for screening for other disorder: Secondary | ICD-10-CM | POA: Diagnosis not present

## 2018-12-26 DIAGNOSIS — E441 Mild protein-calorie malnutrition: Secondary | ICD-10-CM

## 2018-12-26 LAB — POCT URINALYSIS DIPSTICK
Bilirubin, UA: NEGATIVE
GLUCOSE UA: NEGATIVE
Ketones, UA: NEGATIVE
Nitrite, UA: NEGATIVE
Protein, UA: NEGATIVE
RBC UA: NEGATIVE
Spec Grav, UA: 1.01 (ref 1.010–1.025)
Urobilinogen, UA: NEGATIVE E.U./dL — AB
pH, UA: 7 (ref 5.0–8.0)

## 2018-12-26 NOTE — Patient Instructions (Addendum)
  Great to see you today! Keep up the great work.  We'll recheck vitals with nurse in 2 weeks and return to see Rayfield Citizen in clinic in 4 weeks. If you have any questions or concerns or want to be sooner please don't hesitate to call. 1 mile three times a week of running. I will let your team know.  Nurse visit in 2 and me in 4.

## 2018-12-26 NOTE — Progress Notes (Addendum)
THIS RECORD MAY CONTAIN CONFIDENTIAL INFORMATION THAT SHOULD NOT BE RELEASED WITHOUT REVIEW OF THE SERVICE PROVIDER.  Adolescent Medicine Consultation Follow-Up Visit Janet Hahn  is a 23 y.o. female referred by Mliss Sax,* here today for follow-up for eating disorder. She has anorexia, moderate mal nutrition, weight loss, bradycardia, and secondary amenorrhea. .    Plan at last adolescent specialty clinic  visit included continue 3 meals with 2 snacks.  Pertinent Labs? No Growth Chart Viewed? yes   History was provided by the patient.  Chief Complaint  Patient presents with  . Follow-up    DE w.o EVS    HPI:  Reports she is doing well since last office visit however hair loss has picked up. She has not skipped any meals. No recent illnesses. Some bloating and gas but no abdominal pain, nausea, vomiting, diarrhea, constipation. Doing well in school. No anxiety or depression. No SI/HI. She has not had a return of periods but felt like she may be having period cramps about 3 weeks ago- however no blood came. She is not sexually active. She is interested in getting back into running again but understands this may be a low process.  24-hr recall B ( AM)-  Oatmeal with zucchini, cashew butter, chocolate chips Snk (10 AM)-  Cliff bar L ( PM)-  Bagel with PB, soy milk, dates Snk ( PM)-   has not had yet D ( PM)-  Sweet potato with PB, tofu Snk ( PM)-  Ice cream Typical day? Yes.    PCP Confirmed?  yes  No LMP recorded. Allergies  Allergen Reactions  . Lactose Intolerance (Gi) Anaphylaxis    Gas Stomach cramping   Current Outpatient Medications on File Prior to Visit  Medication Sig Dispense Refill  . calcium citrate-vitamin D (CITRACAL+D) 315-200 MG-UNIT tablet Take 2 tablets by mouth daily.     . Multiple Vitamin (MULTIVITAMIN WITH MINERALS) TABS tablet Take 1 tablet by mouth daily.    . ondansetron (ZOFRAN ODT) 4 MG disintegrating tablet Take 1 tablet (4 mg  total) by mouth every 8 (eight) hours as needed for nausea or vomiting. 20 tablet 0   No current facility-administered medications on file prior to visit.     Patient Active Problem List   Diagnosis Date Noted  . Orthostatic dizziness 11/28/2018  . Bradycardia 11/13/2018  . Malnutrition of mild degree (HCC) 11/07/2018  . Prolonged Q-T interval on ECG   . Heart murmur 09/24/2018  . Weight loss 09/24/2018  . Fatigue 04/05/2018  . Consumes a vegan diet 04/05/2018  . Secondary physiologic amenorrhea 04/05/2018   Physical Exam:  Vitals:   12/26/18 1358  BP: 119/75  Pulse: 81  Weight: 120 lb 6.4 oz (54.6 kg)  Height: 5' 6.22" (1.682 m)   BP 119/75   Pulse 81   Ht 5' 6.22" (1.682 m)   Wt 120 lb 6.4 oz (54.6 kg)   BMI 19.30 kg/m  Body mass index: body mass index is 19.3 kg/m. Growth percentile SmartLinks can only be used for patients less than 65 years old.  Physical Exam Vitals signs reviewed.  Constitutional:      General: She is not in acute distress.    Appearance: Normal appearance. She is normal weight. She is not ill-appearing.  HENT:     Head: Normocephalic and atraumatic.     Nose: Nose normal.     Mouth/Throat:     Mouth: Mucous membranes are moist.  Eyes:  Extraocular Movements: Extraocular movements intact.     Conjunctiva/sclera: Conjunctivae normal.  Neck:     Musculoskeletal: Neck supple. No neck rigidity.  Cardiovascular:     Rate and Rhythm: Normal rate and regular rhythm.     Pulses: Normal pulses.  Pulmonary:     Effort: Pulmonary effort is normal. No respiratory distress.     Breath sounds: Normal breath sounds.  Abdominal:     General: Bowel sounds are normal. There is no distension.     Palpations: Abdomen is soft.     Tenderness: There is no abdominal tenderness.  Lymphadenopathy:     Cervical: No cervical adenopathy.  Skin:    General: Skin is warm.     Capillary Refill: Capillary refill takes less than 2 seconds.     Findings: No  rash.  Neurological:     General: No focal deficit present.     Mental Status: She is alert.  Psychiatric:        Mood and Affect: Mood normal.        Behavior: Behavior normal.     Assessment/Plan:  1. Screening for genitourinary condition Trace LE, no UTI symptoms - POCT Urinalysis Dipstick  2. Malnutrition of mild degree (HCC) Weight continues to improve, continue outpatient therapy and nutrition support. Patient interested in starting to run again. 1 mile three times a week discussed with patient. Follow up for vital check in 2 weeks, office visit in 4 weeks.   3. Bradycardia Normal HR today in 80s  4. Orthostatic dizziness Per patient no symptoms of orthostasis. Continue to monitor  5. Secondary physiologic amenorrhea Persistent, anticipate this will resolve as nutrition improves.  Follow-up:  Return in about 4 weeks (around 01/23/2019) for visit w/ adol clinic.   Dolores Patty, DO PGY-3, [facility] 12/26/2018 2:20 PM

## 2019-01-01 DIAGNOSIS — Z713 Dietary counseling and surveillance: Secondary | ICD-10-CM | POA: Diagnosis not present

## 2019-01-02 NOTE — Progress Notes (Signed)
I have reviewed the resident's note and plan of care and helped develop the plan as necessary.  Janet Hahn is doing really well. I will clear her to start running short distances again a few days a week and increase more rapidly if she is able to keep up with the intake.

## 2019-01-08 DIAGNOSIS — Z713 Dietary counseling and surveillance: Secondary | ICD-10-CM | POA: Diagnosis not present

## 2019-01-09 ENCOUNTER — Ambulatory Visit (INDEPENDENT_AMBULATORY_CARE_PROVIDER_SITE_OTHER): Payer: BLUE CROSS/BLUE SHIELD

## 2019-01-09 VITALS — BP 113/70 | HR 74 | Ht 66.14 in | Wt 127.0 lb

## 2019-01-09 DIAGNOSIS — R634 Abnormal weight loss: Secondary | ICD-10-CM | POA: Diagnosis not present

## 2019-01-09 NOTE — Progress Notes (Signed)
Pt here today for vitals check. Collaborated with NP- plan of care made. Follow up scheduled for 3/19.

## 2019-01-15 DIAGNOSIS — Z713 Dietary counseling and surveillance: Secondary | ICD-10-CM | POA: Diagnosis not present

## 2019-01-22 DIAGNOSIS — Z713 Dietary counseling and surveillance: Secondary | ICD-10-CM | POA: Diagnosis not present

## 2019-01-23 ENCOUNTER — Ambulatory Visit (INDEPENDENT_AMBULATORY_CARE_PROVIDER_SITE_OTHER): Payer: BLUE CROSS/BLUE SHIELD | Admitting: Family

## 2019-01-23 ENCOUNTER — Other Ambulatory Visit: Payer: Self-pay

## 2019-01-23 ENCOUNTER — Ambulatory Visit: Payer: BLUE CROSS/BLUE SHIELD | Admitting: Pediatrics

## 2019-01-23 ENCOUNTER — Encounter: Payer: Self-pay | Admitting: Family

## 2019-01-23 VITALS — BP 117/74 | HR 81 | Ht 66.14 in | Wt 135.4 lb

## 2019-01-23 DIAGNOSIS — Z789 Other specified health status: Secondary | ICD-10-CM | POA: Diagnosis not present

## 2019-01-23 DIAGNOSIS — N911 Secondary amenorrhea: Secondary | ICD-10-CM

## 2019-01-23 DIAGNOSIS — R634 Abnormal weight loss: Secondary | ICD-10-CM | POA: Diagnosis not present

## 2019-01-23 NOTE — Progress Notes (Signed)
History was provided by the patient.  Janet Hahn is a 23 y.o. female who is here for DE follow-up.   PCP confirmed? YesMliss Sax, MD  HPI:   -things are going really well -she has no concerns at present -no pain or issues with running -is keeping to the plan of running 1 mile three times weekly.  -no si/hi, no cutting. No purging/bingeing  -no abdominal pain, no constipation  Review of Systems  Constitutional: Negative for chills, fever and malaise/fatigue.  HENT: Negative for sore throat.   Eyes: Negative for blurred vision and double vision.  Respiratory: Negative for shortness of breath.   Cardiovascular: Negative for chest pain and palpitations.  Gastrointestinal: Negative for abdominal pain, heartburn and nausea.  Genitourinary: Negative for dysuria and frequency.  Musculoskeletal: Negative for joint pain and myalgias.  Skin: Negative for rash.  Neurological: Negative for dizziness.  Psychiatric/Behavioral: Negative for depression. The patient is not nervous/anxious.       Patient Active Problem List   Diagnosis Date Noted  . Orthostatic dizziness 11/28/2018  . Bradycardia 11/13/2018  . Malnutrition of mild degree (HCC) 11/07/2018  . Prolonged Q-T interval on ECG   . Heart murmur 09/24/2018  . Weight loss 09/24/2018  . Fatigue 04/05/2018  . Consumes a vegan diet 04/05/2018  . Secondary physiologic amenorrhea 04/05/2018    Current Outpatient Medications on File Prior to Visit  Medication Sig Dispense Refill  . calcium citrate-vitamin D (CITRACAL+D) 315-200 MG-UNIT tablet Take 2 tablets by mouth daily.     . Multiple Vitamin (MULTIVITAMIN WITH MINERALS) TABS tablet Take 1 tablet by mouth daily.    . ondansetron (ZOFRAN ODT) 4 MG disintegrating tablet Take 1 tablet (4 mg total) by mouth every 8 (eight) hours as needed for nausea or vomiting. 20 tablet 0   No current facility-administered medications on file prior to visit.     Allergies   Allergen Reactions  . Lactose Intolerance (Gi) Anaphylaxis    Gas Stomach cramping   Physical Exam:    Vitals:   01/23/19 1031  BP: 117/74  Pulse: 81  Weight: 135 lb 6.4 oz (61.4 kg)  Height: 5' 6.14" (1.68 m)   Wt Readings from Last 3 Encounters:  01/23/19 135 lb 6.4 oz (61.4 kg)  01/09/19 127 lb (57.6 kg)  12/26/18 120 lb 6.4 oz (54.6 kg)    Growth percentile SmartLinks can only be used for patients less than 60 years old. No LMP recorded.  Physical Exam Constitutional:      Appearance: Normal appearance. She is not ill-appearing.  HENT:     Head: Normocephalic.     Mouth/Throat:     Mouth: Mucous membranes are moist.     Pharynx: No posterior oropharyngeal erythema.  Eyes:     Extraocular Movements: Extraocular movements intact.     Pupils: Pupils are equal, round, and reactive to light.  Neck:     Musculoskeletal: Normal range of motion.  Cardiovascular:     Rate and Rhythm: Normal rate and regular rhythm.     Heart sounds: No murmur.  Pulmonary:     Effort: Pulmonary effort is normal.  Abdominal:     General: Abdomen is flat.  Musculoskeletal: Normal range of motion.        General: No swelling.  Lymphadenopathy:     Cervical: No cervical adenopathy.  Skin:    General: Skin is warm and dry.     Findings: No rash.  Neurological:  General: No focal deficit present.     Mental Status: She is alert.  Psychiatric:        Mood and Affect: Mood normal.      Assessment/Plan: 23 yo female with recent weight loss continues to improve nutritional status, was able to add running back and maintain her recovery/weight increase. She may continue at current pace and return in 4 weeks for another check in. Return precautions were given. Period return.

## 2019-02-01 ENCOUNTER — Encounter: Payer: Self-pay | Admitting: Family

## 2019-02-05 DIAGNOSIS — Z713 Dietary counseling and surveillance: Secondary | ICD-10-CM | POA: Diagnosis not present

## 2019-02-19 DIAGNOSIS — Z713 Dietary counseling and surveillance: Secondary | ICD-10-CM | POA: Diagnosis not present

## 2019-02-24 ENCOUNTER — Other Ambulatory Visit: Payer: Self-pay

## 2019-02-24 ENCOUNTER — Ambulatory Visit (INDEPENDENT_AMBULATORY_CARE_PROVIDER_SITE_OTHER): Payer: BLUE CROSS/BLUE SHIELD | Admitting: Pediatrics

## 2019-02-24 DIAGNOSIS — F5000 Anorexia nervosa, unspecified: Secondary | ICD-10-CM

## 2019-02-24 DIAGNOSIS — N911 Secondary amenorrhea: Secondary | ICD-10-CM | POA: Diagnosis not present

## 2019-02-24 NOTE — Progress Notes (Signed)
Virtual Visit via Video Note  I connected with Janet Hahn 's patient  on 02/24/19 at  3:00 PM EDT by a video enabled telemedicine application and verified that I am speaking with the correct person using two identifiers.   Location of patient/parent: At home   I discussed the limitations of evaluation and management by telemedicine and the availability of in person appointments.  I discussed that the purpose of this phone visit is to provide medical care while limiting exposure to the novel coronavirus.  The patient expressed understanding and agreed to proceed.  Reason for visit: F/u anorexia, secondary amenorrhea   History of Present Illness: Janet Hahn has been doing really well since we last saw her. She is still appropriately concerned about getting her period back, but has been having some symptoms like pink vaginal discharge so she thinks it might be soon. She has been tolerating her 10 miles of running well and plans to increase to the 15 miles this week. She would like to compete in the fall, but wants to be fast enough to be good vs. Just on the team. She was 3rd on the team previously. She needs to be up to 40-50 miles and then able to cut down some time by August. She continues to eat well and is slowly getting some hunger cues back. She is meeting meal plan and continues with dietitian and therapist.   ROS negative.    Observations/Objective:   Physical Exam Constitutional:      Appearance: Normal appearance.  Pulmonary:     Effort: Pulmonary effort is normal.  Musculoskeletal: Normal range of motion.  Neurological:     General: No focal deficit present.     Mental Status: She is alert and oriented to person, place, and time.  Psychiatric:        Mood and Affect: Mood normal.        Behavior: Behavior normal.    Assessment and Plan:  1. Anorexia nervosa Overall doing really well. Continues to follow with treatment team and last weight was up to about 145 lb on her home  scale when she was meeting with dietitian. I have given her permission to continue to increase her mileage by 5 miles weekly. She is going to work with a Animator to create a plan to get back to activity safely and in a controlled way. She has been doing some weight bearing exercise with things at home. I emphasized how important this is in keeping her bones healthy while increasing her mileage.   2. Secondary physiologic amenorrhea Dietitian reported a few days after this visit that her period resumed! Will continue to monitor for 3 periods in a row as she increases activity.   Follow Up Instructions: 4 weeks in clinic    I discussed the assessment and treatment plan with the patient and/or parent/guardian. They were provided an opportunity to ask questions and all were answered. They agreed with the plan and demonstrated an understanding of the instructions.   They were advised to call back or seek an in-person evaluation in the emergency room if the symptoms worsen or if the condition fails to improve as anticipated.  I provided 15 minutes of non-face-to-face time during this encounter. I was located at off site during this encounter.  Alfonso Ramus, FNP

## 2019-02-27 ENCOUNTER — Ambulatory Visit: Payer: BLUE CROSS/BLUE SHIELD | Admitting: Pediatrics

## 2019-03-04 DIAGNOSIS — F5000 Anorexia nervosa, unspecified: Secondary | ICD-10-CM | POA: Insufficient documentation

## 2019-03-05 DIAGNOSIS — Z713 Dietary counseling and surveillance: Secondary | ICD-10-CM | POA: Diagnosis not present

## 2019-03-19 DIAGNOSIS — Z713 Dietary counseling and surveillance: Secondary | ICD-10-CM | POA: Diagnosis not present

## 2019-03-24 ENCOUNTER — Other Ambulatory Visit: Payer: Self-pay

## 2019-03-24 ENCOUNTER — Ambulatory Visit (INDEPENDENT_AMBULATORY_CARE_PROVIDER_SITE_OTHER): Payer: BLUE CROSS/BLUE SHIELD | Admitting: Pediatrics

## 2019-03-24 DIAGNOSIS — M76811 Anterior tibial syndrome, right leg: Secondary | ICD-10-CM

## 2019-03-24 DIAGNOSIS — N911 Secondary amenorrhea: Secondary | ICD-10-CM

## 2019-03-24 DIAGNOSIS — S86899A Other injury of other muscle(s) and tendon(s) at lower leg level, unspecified leg, initial encounter: Secondary | ICD-10-CM | POA: Insufficient documentation

## 2019-03-24 DIAGNOSIS — J301 Allergic rhinitis due to pollen: Secondary | ICD-10-CM

## 2019-03-24 DIAGNOSIS — F5000 Anorexia nervosa, unspecified: Secondary | ICD-10-CM

## 2019-03-24 DIAGNOSIS — R634 Abnormal weight loss: Secondary | ICD-10-CM

## 2019-03-24 MED ORDER — FLUTICASONE PROPIONATE 50 MCG/ACT NA SUSP: 2.0000 | Freq: Every day | NASAL | 12 refills | Status: AC

## 2019-03-24 NOTE — Progress Notes (Signed)
Virtual Visit via Video Note  I connected with Janet Hahn 's patient  on 03/24/19 at  2:30 PM EDT by a video enabled telemedicine application and verified that I am speaking with the correct person using two identifiers.   Location of patient/parent: At home   I discussed the limitations of evaluation and management by telemedicine and the availability of in person appointments.  I discussed that the purpose of this phone visit is to provide medical care while limiting exposure to the novel coronavirus.  The patient expressed understanding and agreed to proceed.  Reason for visit: follow up DE  History of Present Illness:  ? Shin splints on both sides- has been doing some ice and massage- taking ibuprofen 400 mg about once a day. She has not yet talked to training staff or coach- she was trying to run through it but it significantly effected her gait on Saturday so she cut the run short. She was running on the plan that was 1 day off a week.   Got her period back for the first time about 3 weeks ago! She was really happy about this. She is not currently sexually active with boyfriend. She plans to get copper IUD in the future.   Continues on meal plan. Has had some headaches x 1 week- thinks is allergies- would like to try flonase.    Observations/Objective:  Physical Exam  Constitutional: She is oriented to person, place, and time and well-developed, well-nourished, and in no distress.  Pulmonary/Chest: Effort normal.  Musculoskeletal: Normal range of motion.  Neurological: She is alert and oriented to person, place, and time.  Psychiatric: Mood and affect normal.     Assessment and Plan:  1. Seasonal allergic rhinitis due to pollen Will try flonase to see if this improves sx.  - fluticasone (FLONASE) 50 MCG/ACT nasal spray; Place 2 sprays into both nostrils daily.  Dispense: 16 g; Refill: 12  2. Secondary physiologic amenorrhea Is now menstruating. Will watch for 3 cycles.    3. Weight loss Weight is stable according to dietitian about 147 lbs.   4. Anorexia nervosa Doing very well. Continues with treatment team.   5. Anterior shin splints  Increase ibuprofen to 800 mg TID for the next week. Continue ice 20 on/off each hour. Speak with coach and training staff. Discussed warning signs of stress reaction/fracture. Discussed increasing strengthening exercises and having someone watch her running form to ensure she isn't doing anything to exacerbate. She was in agreement.   Follow Up Instructions: 4 weeks or sooner as needed for shin splints.    I discussed the assessment and treatment plan with the patient and/or parent/guardian. They were provided an opportunity to ask questions and all were answered. They agreed with the plan and demonstrated an understanding of the instructions.   They were advised to call back or seek an in-person evaluation in the emergency room if the symptoms worsen or if the condition fails to improve as anticipated.  I provided 15 minutes of non-face-to-face time and 0 minutes of care coordination during this encounter I was located off site during this encounter.  Alfonso Ramus, FNP

## 2019-04-02 DIAGNOSIS — Z713 Dietary counseling and surveillance: Secondary | ICD-10-CM | POA: Diagnosis not present

## 2019-04-03 ENCOUNTER — Telehealth: Payer: Self-pay | Admitting: Family Medicine

## 2019-04-03 DIAGNOSIS — Z111 Encounter for screening for respiratory tuberculosis: Secondary | ICD-10-CM

## 2019-04-03 DIAGNOSIS — Z23 Encounter for immunization: Secondary | ICD-10-CM

## 2019-04-03 NOTE — Telephone Encounter (Signed)
Check with the state to see when her last Tdap was and also find out the reasoning behind needing the QuantiFERON

## 2019-04-03 NOTE — Telephone Encounter (Signed)
Pt called and wants a TDAP and quantiferon. Please advise pt at [phone].

## 2019-04-03 NOTE — Telephone Encounter (Signed)
Please put in order for Quantiferon. 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 Pt coming in tomorrow.Thanks.

## 2019-04-04 ENCOUNTER — Other Ambulatory Visit (INDEPENDENT_AMBULATORY_CARE_PROVIDER_SITE_OTHER): Payer: BLUE CROSS/BLUE SHIELD

## 2019-04-04 ENCOUNTER — Other Ambulatory Visit: Payer: Self-pay

## 2019-04-04 DIAGNOSIS — Z23 Encounter for immunization: Secondary | ICD-10-CM | POA: Diagnosis not present

## 2019-04-04 DIAGNOSIS — Z111 Encounter for screening for respiratory tuberculosis: Secondary | ICD-10-CM | POA: Diagnosis not present

## 2019-04-07 LAB — QUANTIFERON-TB GOLD PLUS
QuantiFERON Mitogen Value: >10 IU/mL
QuantiFERON Nil Value: 0.02 IU/mL
QuantiFERON TB1 Ag Value: 0.02 IU/mL
QuantiFERON TB2 Ag Value: 0.02 IU/mL
QuantiFERON-TB Gold Plus: NEGATIVE

## 2019-04-22 ENCOUNTER — Telehealth: Payer: Self-pay | Admitting: Pediatrics

## 2019-04-22 NOTE — Telephone Encounter (Signed)
Hi Janet Hahn,  I left you a voicemail but wanted to follow up via email as well. I have canceled your appointment for this week and rescheduled for Monday, June 22 at 9:00 via doximity video, so you will receive a text to join virtually at that time. Let me know if this does not work for you!  Best,   Elester Apodaca Hahn, [place] [facility] [place] [facility] [place]  [phone] - direct line [phone] - fax number  From: Janet Hahn [email or web address]>  Sent: Tuesday, April 22, 2019 8:27 AM To: Janet Hahn [email or web address]> Subject: Fwd: [External Email]6/17 telehealth meeting  Can you reschedule [facility] for next week? Thanks!! Get Outlook for iOS  From: [facility] [email or web address]> Sent: Tuesday, April 22, 2019 7:57:51 AM To: Janet Hahn [email or web address]> Subject: [External Email]6/17 telehealth meeting    *Caution - External email - see footer for warnings*  Janet Hahn, Could I please reschedule my appointment to next week? 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 It doesn't have a set end time so I'm confident that I will not be able to make my scheduled appointment tomorrow. The class ends on Friday, so I am available anytime next week. I hope this is okay! See you soon, [facility]

## 2019-04-23 ENCOUNTER — Ambulatory Visit: Payer: BLUE CROSS/BLUE SHIELD | Admitting: Pediatrics

## 2019-04-28 ENCOUNTER — Other Ambulatory Visit: Payer: Self-pay

## 2019-04-28 ENCOUNTER — Ambulatory Visit (INDEPENDENT_AMBULATORY_CARE_PROVIDER_SITE_OTHER): Payer: BC Managed Care – PPO | Admitting: Pediatrics

## 2019-04-28 DIAGNOSIS — N911 Secondary amenorrhea: Secondary | ICD-10-CM | POA: Diagnosis not present

## 2019-04-28 DIAGNOSIS — F5000 Anorexia nervosa, unspecified: Secondary | ICD-10-CM

## 2019-04-28 NOTE — Progress Notes (Signed)
Virtual Visit via Video Note  I connected with Janet Hahn 's patient  on 04/28/19 at  9:00 AM EDT by a video enabled telemedicine application and verified that I am speaking with the correct person using two identifiers.   Location of patient/parent: at home   I discussed the limitations of evaluation and management by telemedicine and the availability of in person appointments.  I discussed that the purpose of this telehealth visit is to provide medical care while limiting exposure to the novel coronavirus.  The patient expressed understanding and agreed to proceed.  Reason for visit: f/u eating disorder, amenorrhea.   History of Present Illness: Taking and finishing one online class  Not planning to run competitively next year- running about 20 miles a week for "fun", has had issues with the coach in the past and d/t covid and unsure what the fall will hold, just decided to make her own decision not to run. She is very comfortable with this. Parents are supportive.  Feels like meal plan is going fine. Doesn't really have normal hunger cues but doesn't get overly full.  Janet Hahn knows that she isn't running, will share with laura on Wednesday.   3rd period last week.   Review of Systems  Constitutional: Negative for malaise/fatigue.  Eyes: Negative for double vision.  Respiratory: Negative for shortness of breath.   Cardiovascular: Negative for chest pain and palpitations.  Gastrointestinal: Negative for abdominal pain, constipation, diarrhea, nausea and vomiting.  Genitourinary: Negative for dysuria.  Musculoskeletal: Negative for joint pain and myalgias.  Skin: Negative for rash.  Neurological: Negative for dizziness and headaches.  Endo/Heme/Allergies: Does not bruise/bleed easily.  Psychiatric/Behavioral: Negative for depression. The patient is not nervous/anxious and does not have insomnia.       Observations/Objective:  Cheerful and upbeat, well appearing. Normal mood and  affect.   Assessment and Plan:  1. Anorexia nervosa Weight is stable around 145 lb per dietitian. She is eating meal plan well. We discussed having a conversation with dietitian about moving to more mindful eating now that she is not going to need to increase running miles so she can begin to recognize hunger cues.   2. Secondary physiologic amenorrhea 3 periods now, not totally regular, but much improved.    Follow Up Instructions: 3 months in clinic or sooner as needed.    I discussed the assessment and treatment plan with the patient and/or parent/guardian. They were provided an opportunity to ask questions and all were answered. They agreed with the plan and demonstrated an understanding of the instructions.   They were advised to call back or seek an in-person evaluation in the emergency room if the symptoms worsen or if the condition fails to improve as anticipated.  I provided 10 minutes of non-face-to-face time and 0 minutes of care coordination during this encounter I was located at off site during this encounter.  Jonathon Resides, FNP

## 2019-04-30 DIAGNOSIS — Z713 Dietary counseling and surveillance: Secondary | ICD-10-CM | POA: Diagnosis not present

## 2019-05-28 DIAGNOSIS — Z713 Dietary counseling and surveillance: Secondary | ICD-10-CM | POA: Diagnosis not present

## 2019-06-16 DIAGNOSIS — Z713 Dietary counseling and surveillance: Secondary | ICD-10-CM | POA: Diagnosis not present

## 2019-08-04 DIAGNOSIS — Z713 Dietary counseling and surveillance: Secondary | ICD-10-CM | POA: Diagnosis not present

## 2019-08-06 DIAGNOSIS — Z30430 Encounter for insertion of intrauterine contraceptive device: Secondary | ICD-10-CM | POA: Diagnosis not present

## 2019-08-06 DIAGNOSIS — Z3202 Encounter for pregnancy test, result negative: Secondary | ICD-10-CM | POA: Diagnosis not present

## 2019-09-18 DIAGNOSIS — Z30431 Encounter for routine checking of intrauterine contraceptive device: Secondary | ICD-10-CM | POA: Diagnosis not present

## 2019-11-24 DIAGNOSIS — N939 Abnormal uterine and vaginal bleeding, unspecified: Secondary | ICD-10-CM | POA: Diagnosis not present

## 2019-11-24 DIAGNOSIS — Z30431 Encounter for routine checking of intrauterine contraceptive device: Secondary | ICD-10-CM | POA: Diagnosis not present

## 2019-12-08 DIAGNOSIS — D220 Melanocytic nevi of lip: Secondary | ICD-10-CM | POA: Diagnosis not present

## 2019-12-08 DIAGNOSIS — D225 Melanocytic nevi of trunk: Secondary | ICD-10-CM | POA: Diagnosis not present

## 2020-01-13 DIAGNOSIS — N945 Secondary dysmenorrhea: Secondary | ICD-10-CM | POA: Diagnosis not present

## 2020-01-13 DIAGNOSIS — Z30432 Encounter for removal of intrauterine contraceptive device: Secondary | ICD-10-CM | POA: Diagnosis not present

## 2020-01-13 DIAGNOSIS — N939 Abnormal uterine and vaginal bleeding, unspecified: Secondary | ICD-10-CM | POA: Diagnosis not present

## 2020-03-16 ENCOUNTER — Telehealth: Payer: BC Managed Care – PPO | Admitting: Physician Assistant

## 2020-03-16 ENCOUNTER — Encounter: Payer: Self-pay | Admitting: Physician Assistant

## 2020-03-16 DIAGNOSIS — N3000 Acute cystitis without hematuria: Secondary | ICD-10-CM

## 2020-03-16 MED ORDER — CEPHALEXIN 500 MG PO CAPS: 500.0000 mg | ORAL_CAPSULE | Freq: Two times a day (BID) | ORAL | 0 refills | Status: AC

## 2020-03-16 NOTE — Progress Notes (Signed)

## 2020-04-06 ENCOUNTER — Telehealth: Payer: BC Managed Care – PPO | Admitting: Nurse Practitioner

## 2020-04-06 DIAGNOSIS — R059 Cough, unspecified: Secondary | ICD-10-CM

## 2020-04-06 DIAGNOSIS — R05 Cough: Secondary | ICD-10-CM

## 2020-04-06 MED ORDER — AZITHROMYCIN 250 MG PO TABS: ORAL_TABLET | ORAL | 0 refills | Status: AC

## 2020-04-06 MED ORDER — BENZONATATE 100 MG PO CAPS: 100.0000 mg | ORAL_CAPSULE | Freq: Three times a day (TID) | ORAL | 0 refills | Status: AC | PRN

## 2020-04-06 NOTE — Progress Notes (Signed)
We are sorry that you are not feeling well.  Here is how we plan to help!  Based on your presentation I believe you most likely have A cough due to bacteria.  When patients have a fever and a productive cough with a change in color or increased sputum production, we are concerned about bacterial bronchitis.  If left untreated it can progress to pneumonia.  If your symptoms do not improve with your treatment plan it is important that you contact your provider.   I have prescribed Azithromyin 250 mg: two tablets now and then one tablet daily for 4 additonal days    In addition you may use A prescription cough medication called Tessalon Perles 100mg. You may take 1-2 capsules every 8 hours as needed for your cough.   From your responses in the eVisit questionnaire you describe inflammation in the upper respiratory tract which is causing a significant cough.  This is commonly called Bronchitis and has four common causes:    Allergies  Viral Infections  Acid Reflux  Bacterial Infection Allergies, viruses and acid reflux are treated by controlling symptoms or eliminating the cause. An example might be a cough caused by taking certain blood pressure medications. You stop the cough by changing the medication. Another example might be a cough caused by acid reflux. Controlling the reflux helps control the cough.  USE OF BRONCHODILATOR ("RESCUE") INHALERS: There is a risk from using your bronchodilator too frequently.  The risk is that over-reliance on a medication which only relaxes the muscles surrounding the breathing tubes can reduce the effectiveness of medications prescribed to reduce swelling and congestion of the tubes themselves.  Although you feel brief relief from the bronchodilator inhaler, your asthma may actually be worsening with the tubes becoming more swollen and filled with mucus.  This can delay other crucial treatments, such as oral steroid medications. If you need to use a  bronchodilator inhaler daily, several times per day, you should discuss this with your provider.  There are probably better treatments that could be used to keep your asthma under control.     HOME CARE . Only take medications as instructed by your medical team. . Complete the entire course of an antibiotic. . Drink plenty of fluids and get plenty of rest. . Avoid close contacts especially the very young and the elderly . Cover your mouth if you cough or cough into your sleeve. . Always remember to wash your hands . A steam or ultrasonic humidifier can help congestion.   GET HELP RIGHT AWAY IF: . You develop worsening fever. . You become short of breath . You cough up blood. . Your symptoms persist after you have completed your treatment plan MAKE SURE YOU   Understand these instructions.  Will watch your condition.  Will get help right away if you are not doing well or get worse.  Your e-visit answers were reviewed by a board certified advanced clinical practitioner to complete your personal care plan.  Depending on the condition, your plan could have included both over the counter or prescription medications. If there is a problem please reply  once you have received a response from your provider. Your safety is important to us.  If you have drug allergies check your prescription carefully.    You can use MyChart to ask questions about today's visit, request a non-urgent call back, or ask for a work or school excuse for 24 hours related to this e-Visit. If it has been   greater than 24 hours you will need to follow up with your provider, or enter a new e-Visit to address those concerns. You will get an e-mail in the next two days asking about your experience.  I hope that your e-visit has been valuable and will speed your recovery. Thank you for using e-visits.  5-10 minutes spent reviewing and documenting in chart.  

## 2020-04-12 ENCOUNTER — Telehealth: Payer: Self-pay | Admitting: Family Medicine

## 2020-04-12 DIAGNOSIS — Z1159 Encounter for screening for other viral diseases: Secondary | ICD-10-CM

## 2020-04-12 DIAGNOSIS — Z111 Encounter for screening for respiratory tuberculosis: Secondary | ICD-10-CM

## 2020-04-12 NOTE — Telephone Encounter (Signed)
I put the order in

## 2020-04-12 NOTE — Telephone Encounter (Signed)
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 She states she needs TB Blood test and HEB B antibody test. Please place orders and advise pt at [phone].

## 2020-04-12 NOTE — Telephone Encounter (Signed)
Done KH 

## 2020-04-15 ENCOUNTER — Other Ambulatory Visit: Payer: Self-pay

## 2020-04-15 ENCOUNTER — Other Ambulatory Visit: Payer: BC Managed Care – PPO

## 2020-04-15 DIAGNOSIS — Z111 Encounter for screening for respiratory tuberculosis: Secondary | ICD-10-CM | POA: Diagnosis not present

## 2020-04-15 DIAGNOSIS — Z1159 Encounter for screening for other viral diseases: Secondary | ICD-10-CM | POA: Diagnosis not present

## 2020-04-17 ENCOUNTER — Encounter: Payer: Self-pay | Admitting: Family Medicine

## 2020-04-17 DIAGNOSIS — Z1159 Encounter for screening for other viral diseases: Secondary | ICD-10-CM

## 2020-04-17 LAB — QUANTIFERON-TB GOLD PLUS
QuantiFERON Mitogen Value: >10 IU/mL
QuantiFERON Nil Value: 0.02 IU/mL
QuantiFERON TB1 Ag Value: 0.08 IU/mL
QuantiFERON TB2 Ag Value: 0.04 IU/mL
QuantiFERON-TB Gold Plus: NEGATIVE

## 2020-04-17 LAB — HEPATITIS B SURFACE ANTIBODY,QUALITATIVE: Hep B Surface Ab, Qual: NONREACTIVE

## 2020-04-18 NOTE — Progress Notes (Signed)
It looks like she needs hepatitis B injections and let her know that the TB skin test was negative

## 2020-04-19 ENCOUNTER — Other Ambulatory Visit: Payer: BC Managed Care – PPO

## 2020-04-19 ENCOUNTER — Telehealth: Payer: Self-pay

## 2020-04-19 ENCOUNTER — Telehealth: Payer: Self-pay | Admitting: Medical

## 2020-04-19 NOTE — Telephone Encounter (Signed)
Patient called in and needs updated booster for hepatitis B as she is not immune despite prior hepatitis B vaccination.  She apparently already talked to Dr. Susann Givens about this.  She can come in for the 2 shot Heplisav B vaccine series given a month apart.

## 2020-04-19 NOTE — Telephone Encounter (Signed)
Pt is not sure of the hep b vaccine that will be excepted and reached out to the school and is currently waiting a reply. Once  she finds out she will call and set up an appt. KH

## 2020-04-21 ENCOUNTER — Other Ambulatory Visit (INDEPENDENT_AMBULATORY_CARE_PROVIDER_SITE_OTHER): Payer: BC Managed Care – PPO

## 2020-04-21 ENCOUNTER — Other Ambulatory Visit: Payer: Self-pay

## 2020-04-21 DIAGNOSIS — Z23 Encounter for immunization: Secondary | ICD-10-CM | POA: Diagnosis not present

## 2020-04-21 DIAGNOSIS — Z1159 Encounter for screening for other viral diseases: Secondary | ICD-10-CM

## 2020-04-21 NOTE — Telephone Encounter (Signed)
See message.

## 2020-05-21 ENCOUNTER — Other Ambulatory Visit (INDEPENDENT_AMBULATORY_CARE_PROVIDER_SITE_OTHER): Payer: BC Managed Care – PPO

## 2020-05-21 ENCOUNTER — Other Ambulatory Visit: Payer: Self-pay

## 2020-05-21 DIAGNOSIS — Z23 Encounter for immunization: Secondary | ICD-10-CM

## 2020-05-21 DIAGNOSIS — Z1159 Encounter for screening for other viral diseases: Secondary | ICD-10-CM

## 2020-05-22 LAB — HEPATITIS B SURFACE ANTIBODY, QUANTITATIVE: Hepatitis B Surf Ab Quant: >1000 m[IU]/mL (ref >9.9)

## 2021-11-23 ENCOUNTER — Telehealth: Payer: Self-pay

## 2021-11-23 NOTE — Telephone Encounter (Signed)
Pt was called to find out if she is still a pt of Dr. Susann Givens and if so we need the name of the ob/gyn office . KH

## 2022-07-12 ENCOUNTER — Encounter: Payer: Self-pay | Admitting: Internal Medicine

## 2022-08-15 ENCOUNTER — Encounter: Payer: Self-pay | Admitting: Internal Medicine

## 2022-08-28 ENCOUNTER — Encounter: Payer: Self-pay | Admitting: Internal Medicine

## 2024-01-01 ENCOUNTER — Encounter: Payer: Self-pay | Admitting: Internal Medicine
